# Patient Record
Sex: Female | Born: 1940
Health system: Southern US, Community
[De-identification: ages and names within clinical notes are randomized; demographics above are authoritative.]

## PROBLEM LIST (undated history)

## (undated) DIAGNOSIS — M1990 Unspecified osteoarthritis, unspecified site: Secondary | ICD-10-CM

## (undated) DIAGNOSIS — I10 Essential (primary) hypertension: Secondary | ICD-10-CM

## (undated) DIAGNOSIS — D649 Anemia, unspecified: Secondary | ICD-10-CM

## (undated) DIAGNOSIS — B009 Herpesviral infection, unspecified: Secondary | ICD-10-CM

## (undated) DIAGNOSIS — J45909 Unspecified asthma, uncomplicated: Secondary | ICD-10-CM

## (undated) DIAGNOSIS — R000 Tachycardia, unspecified: Secondary | ICD-10-CM

## (undated) DIAGNOSIS — K219 Gastro-esophageal reflux disease without esophagitis: Secondary | ICD-10-CM

## (undated) DIAGNOSIS — H269 Unspecified cataract: Secondary | ICD-10-CM

## (undated) DIAGNOSIS — R001 Bradycardia, unspecified: Secondary | ICD-10-CM

## (undated) DIAGNOSIS — E785 Hyperlipidemia, unspecified: Secondary | ICD-10-CM

## (undated) DIAGNOSIS — IMO0002 Reserved for concepts with insufficient information to code with codable children: Secondary | ICD-10-CM

## (undated) DIAGNOSIS — Z860100 Personal history of colon polyps, unspecified: Secondary | ICD-10-CM

## (undated) DIAGNOSIS — R9430 Abnormal result of cardiovascular function study, unspecified: Secondary | ICD-10-CM

## (undated) DIAGNOSIS — R011 Cardiac murmur, unspecified: Secondary | ICD-10-CM

## (undated) DIAGNOSIS — K589 Irritable bowel syndrome without diarrhea: Secondary | ICD-10-CM

## (undated) DIAGNOSIS — J189 Pneumonia, unspecified organism: Secondary | ICD-10-CM

## (undated) DIAGNOSIS — Z85828 Personal history of other malignant neoplasm of skin: Secondary | ICD-10-CM

## (undated) DIAGNOSIS — E663 Overweight: Secondary | ICD-10-CM

## (undated) HISTORY — DX: Personal history of other malignant neoplasm of skin: Z85.828

## (undated) HISTORY — DX: Essential (primary) hypertension: I10

## (undated) HISTORY — DX: Unspecified asthma, uncomplicated: J45.909

## (undated) HISTORY — DX: Anemia, unspecified: D64.9

## (undated) HISTORY — DX: Hyperlipidemia, unspecified: E78.5

## (undated) HISTORY — DX: Personal history of colon polyps, unspecified: Z86.0100

## (undated) HISTORY — DX: Pneumonia, unspecified organism: J18.9

## (undated) HISTORY — DX: Personal history of colonic polyps: Z86.010

## (undated) HISTORY — DX: Bradycardia, unspecified: R00.1

## (undated) HISTORY — DX: Unspecified osteoarthritis, unspecified site: M19.90

## (undated) HISTORY — DX: Tachycardia, unspecified: R00.0

## (undated) HISTORY — PX: OTHER SURGICAL HISTORY: SHX169

## (undated) HISTORY — DX: Irritable bowel syndrome, unspecified: K58.9

## (undated) HISTORY — DX: Unspecified cataract: H26.9

## (undated) HISTORY — DX: Herpesviral infection, unspecified: B00.9

## (undated) HISTORY — PX: PARTIAL HYSTERECTOMY: SHX80

## (undated) HISTORY — PX: OVARY SURGERY: SHX727

## (undated) HISTORY — PX: TONSILLECTOMY: SUR1361

## (undated) HISTORY — PX: CATARACT EXTRACTION: SUR2

## (undated) HISTORY — DX: Reserved for concepts with insufficient information to code with codable children: IMO0002

## (undated) HISTORY — PX: APPENDECTOMY: SHX54

## (undated) HISTORY — DX: Abnormal result of cardiovascular function study, unspecified: R94.30

## (undated) HISTORY — DX: Gastro-esophageal reflux disease without esophagitis: K21.9

## (undated) HISTORY — DX: Overweight: E66.3

---

## 1999-06-04 ENCOUNTER — Ambulatory Visit (HOSPITAL_COMMUNITY): Admission: RE | Admit: 1999-06-04 | Discharge: 1999-06-04 | Payer: Self-pay | Admitting: Internal Medicine

## 1999-06-21 ENCOUNTER — Emergency Department (HOSPITAL_COMMUNITY): Admission: EM | Admit: 1999-06-21 | Discharge: 1999-06-21 | Payer: Self-pay | Admitting: *Deleted

## 2000-05-17 ENCOUNTER — Encounter: Admission: RE | Admit: 2000-05-17 | Discharge: 2000-05-17 | Payer: Self-pay | Admitting: Internal Medicine

## 2000-05-17 ENCOUNTER — Encounter: Payer: Self-pay | Admitting: Internal Medicine

## 2002-04-17 ENCOUNTER — Other Ambulatory Visit: Admission: RE | Admit: 2002-04-17 | Discharge: 2002-04-17 | Payer: Self-pay | Admitting: Gynecology

## 2002-04-26 ENCOUNTER — Encounter: Payer: Self-pay | Admitting: Internal Medicine

## 2002-04-26 ENCOUNTER — Encounter: Admission: RE | Admit: 2002-04-26 | Discharge: 2002-04-26 | Payer: Self-pay | Admitting: Internal Medicine

## 2002-07-03 ENCOUNTER — Ambulatory Visit (HOSPITAL_COMMUNITY): Admission: RE | Admit: 2002-07-03 | Discharge: 2002-07-03 | Payer: Self-pay | Admitting: Internal Medicine

## 2003-01-15 ENCOUNTER — Ambulatory Visit (HOSPITAL_BASED_OUTPATIENT_CLINIC_OR_DEPARTMENT_OTHER): Admission: RE | Admit: 2003-01-15 | Discharge: 2003-01-15 | Payer: Self-pay | Admitting: Internal Medicine

## 2003-04-22 ENCOUNTER — Other Ambulatory Visit: Admission: RE | Admit: 2003-04-22 | Discharge: 2003-04-22 | Payer: Self-pay | Admitting: Gynecology

## 2004-05-06 ENCOUNTER — Other Ambulatory Visit: Admission: RE | Admit: 2004-05-06 | Discharge: 2004-05-06 | Payer: Self-pay | Admitting: Gynecology

## 2004-10-22 ENCOUNTER — Ambulatory Visit (HOSPITAL_COMMUNITY): Admission: RE | Admit: 2004-10-22 | Discharge: 2004-10-22 | Payer: Self-pay | Admitting: Internal Medicine

## 2005-07-06 ENCOUNTER — Other Ambulatory Visit: Admission: RE | Admit: 2005-07-06 | Discharge: 2005-07-06 | Payer: Self-pay | Admitting: Gynecology

## 2005-12-15 ENCOUNTER — Encounter: Admission: RE | Admit: 2005-12-15 | Discharge: 2005-12-15 | Payer: Self-pay | Admitting: Internal Medicine

## 2006-05-02 ENCOUNTER — Encounter: Admission: RE | Admit: 2006-05-02 | Discharge: 2006-05-02 | Payer: Self-pay | Admitting: Internal Medicine

## 2006-06-02 ENCOUNTER — Encounter: Admission: RE | Admit: 2006-06-02 | Discharge: 2006-06-02 | Payer: Self-pay | Admitting: Internal Medicine

## 2006-06-05 ENCOUNTER — Encounter: Admission: RE | Admit: 2006-06-05 | Discharge: 2006-06-05 | Payer: Self-pay | Admitting: Internal Medicine

## 2006-12-07 ENCOUNTER — Encounter: Admission: RE | Admit: 2006-12-07 | Discharge: 2006-12-07 | Payer: Self-pay | Admitting: Internal Medicine

## 2007-01-12 ENCOUNTER — Encounter: Admission: RE | Admit: 2007-01-12 | Discharge: 2007-01-12 | Payer: Self-pay | Admitting: Internal Medicine

## 2007-09-12 ENCOUNTER — Encounter: Admission: RE | Admit: 2007-09-12 | Discharge: 2007-09-12 | Payer: Self-pay | Admitting: Internal Medicine

## 2008-09-17 ENCOUNTER — Encounter: Payer: Self-pay | Admitting: Internal Medicine

## 2008-09-17 DIAGNOSIS — B009 Herpesviral infection, unspecified: Secondary | ICD-10-CM | POA: Insufficient documentation

## 2008-09-17 DIAGNOSIS — Z860100 Personal history of colon polyps, unspecified: Secondary | ICD-10-CM | POA: Insufficient documentation

## 2008-09-17 DIAGNOSIS — K589 Irritable bowel syndrome without diarrhea: Secondary | ICD-10-CM | POA: Insufficient documentation

## 2008-09-17 DIAGNOSIS — K649 Unspecified hemorrhoids: Secondary | ICD-10-CM | POA: Insufficient documentation

## 2008-09-18 ENCOUNTER — Ambulatory Visit: Payer: Self-pay | Admitting: Internal Medicine

## 2008-09-23 ENCOUNTER — Ambulatory Visit: Payer: Self-pay | Admitting: Internal Medicine

## 2009-02-13 ENCOUNTER — Ambulatory Visit: Payer: Self-pay | Admitting: Cardiology

## 2009-02-13 ENCOUNTER — Encounter (INDEPENDENT_AMBULATORY_CARE_PROVIDER_SITE_OTHER): Payer: Self-pay | Admitting: Emergency Medicine

## 2009-02-13 ENCOUNTER — Observation Stay (HOSPITAL_COMMUNITY): Admission: EM | Admit: 2009-02-13 | Discharge: 2009-02-15 | Payer: Self-pay | Admitting: Emergency Medicine

## 2009-02-17 ENCOUNTER — Telehealth (INDEPENDENT_AMBULATORY_CARE_PROVIDER_SITE_OTHER): Payer: Self-pay | Admitting: Radiology

## 2009-02-17 ENCOUNTER — Ambulatory Visit: Payer: Self-pay

## 2009-02-17 ENCOUNTER — Ambulatory Visit: Payer: Self-pay | Admitting: Cardiology

## 2009-02-17 DIAGNOSIS — Z85828 Personal history of other malignant neoplasm of skin: Secondary | ICD-10-CM | POA: Insufficient documentation

## 2009-02-17 DIAGNOSIS — I498 Other specified cardiac arrhythmias: Secondary | ICD-10-CM | POA: Insufficient documentation

## 2009-02-17 DIAGNOSIS — E785 Hyperlipidemia, unspecified: Secondary | ICD-10-CM | POA: Insufficient documentation

## 2009-02-18 ENCOUNTER — Encounter: Payer: Self-pay | Admitting: Cardiology

## 2009-02-18 ENCOUNTER — Ambulatory Visit: Payer: Self-pay

## 2009-03-18 ENCOUNTER — Ambulatory Visit: Payer: Self-pay | Admitting: Cardiology

## 2010-07-21 ENCOUNTER — Encounter: Payer: Self-pay | Admitting: Cardiology

## 2010-07-22 ENCOUNTER — Ambulatory Visit: Payer: Self-pay | Admitting: Cardiology

## 2010-07-22 ENCOUNTER — Encounter: Payer: Self-pay | Admitting: Cardiology

## 2010-07-22 DIAGNOSIS — R002 Palpitations: Secondary | ICD-10-CM | POA: Insufficient documentation

## 2010-07-28 ENCOUNTER — Ambulatory Visit: Payer: Self-pay

## 2010-08-27 ENCOUNTER — Ambulatory Visit: Payer: Self-pay | Admitting: Cardiology

## 2010-10-06 ENCOUNTER — Ambulatory Visit: Payer: Self-pay | Admitting: Cardiology

## 2010-10-31 ENCOUNTER — Encounter: Payer: Self-pay | Admitting: Internal Medicine

## 2010-11-09 NOTE — Assessment & Plan Note (Signed)
Summary: F1Y/CHEST DISCOMFORT/RACING HEART/JML   Visit Type:  Follow-up Primary Provider:  Nila Nephew, MD  CC:  rapid heartbeat.  History of Present Illness: The patient is seen for cardiology followup.  I saw her last in June, 2010.  She had been hospitalized in May, 2010.  He felt weak and there was some bradycardia at that time.  There was no MI.  2-D echo revealed an ejection fraction of 60%.  She had a nuclear stress test as an outpatient.  She walked for only 4 minutes.  However the ejection fraction was normal and there was no definite ischemia.  She has been on verapamil.  Today she tells me that she has had sensation of rapid heartbeat.  She has filled her prescription for metoprolol, but she has not yet taken any.  She's not having any significant chest pain.  There is no syncope or presyncope.  Current Medications (verified): 1)  Verapamil Hcl Cr 240 Mg Xr24h-Cap (Verapamil Hcl) .... Take 1 Tablet By Mouth Once A Day 2)  Tylenol Extra Strength 500 Mg Tabs (Acetaminophen) .... As Needed 3)  Furosemide 40 Mg Tabs (Furosemide) .... Take 1/2 Tablet By Mouth Daily. 4)  Metoprolol Succinate 25 Mg Xr24h-Tab (Metoprolol Succinate) .... Take One Tablet By Mouth Daily --- Not Started 5)  Detrol La 4 Mg Xr24h-Cap (Tolterodine Tartrate) .... Once Daily  Allergies (verified): No Known Drug Allergies  Past History:  Past Medical History: EF 60%.... echo.... May, 2010 Nuclear stress... May, 2010.... walked 4 minutes.... EF 70%... no scar or ischemia DYSLIPIDEMIA (ICD-272.4) BRADYCARDIA (ICD-427.89)  ... improved with beta blocker stopped Overweight IBS (ICD-564.1) HEMORRHOIDS (ICD-455.6) COLONIC POLYPS, HX OF (ICD-V12.72) HERPES SIMPLEX INFECTION, TYPE I (ICD-054.9) SKIN CANCER, HX OF (ICD-V10.83) Chest pain Light treatment for skin disease at her dermatologist Rapid heartbeat    October, 2011  Review of Systems       Patient denies fever, chills, headache, sweats, rash, change  in vision, change in hearing, chest pain, cough, nausea vomiting, urinary symptoms.  All other systems are reviewed and are negative.  Vital Signs:  Patient profile:   70 year old female Height:      62.5 inches Weight:      220 pounds BMI:     39.74 Pulse rate:   73 / minute BP sitting:   132 / 78  (left arm) Cuff size:   large  Vitals Entered By: Hardin Negus, RMA (July 22, 2010 9:25 AM)  Physical Exam  General:  The patient is stable but overweight. Head:  head is atraumatic. Eyes:  no xanthelasma. Neck:  no jugular venous distention. Chest Wall:  no chest wall tenderness. Lungs:  lungs are clear.  Respiratory effort is nonlabored. Heart:  cardiac exam reveals S1-S2.  No clicks.  There is a very soft systolic murmur. Abdomen:  abdomen is obese but soft. Msk:  no musculoskeletal deformities. Extremities:  no peripheral edema. Psych:  patient is oriented to person time and place.  Affect is normal.   Impression & Recommendations:  Problem # 1:  * RAPID HEARTBEAT  The patient has complained of rapid heart rate.  Beta blocker was suggested to her.  She has not filled it because she remembers that there was some slow heart rate in 2010.  There was no documented severe bradycardia at that time.  He was mild bradycardia per the records.  She is having continued symptoms.  We will proceed with an event recorder to see if we can document  what rhythm she is actually having.  In the meantime she will remain on verapamil but she will not yet start the beta blocker.EKG is done today and reviewed by me.  There is normal sinus rhythm and no significant change.  Orders: Event (Event)  Problem # 2:  CHEST PAIN (ICD-786.50) She has not had any significant chest pain.  Problem # 3:  BRADYCARDIA (ICD-427.89) At this point she is not bradycardic.  This will be followed and we will see what her rhythm is with the event recorder.  Problem # 4:  HERPES SIMPLEX INFECTION, TYPE I  (ICD-054.9) The patient receives a type of light treatment from her dermatologist.  We need to be careful as to whether or not this will affect the monitor that is placed.  Other Orders: EKG w/ Interpretation (93000)  Patient Instructions: 1)  Your physician has recommended that you wear an event monitor.  Event monitors are medical devices that record the heart's electrical activity. Doctors most often use these monitors to diagnose arrhythmias. Arrhythmias are problems with the speed or rhythm of the heartbeat. The monitor is a small, portable device. You can wear one while you do your normal daily activities. This is usually used to diagnose what is causing palpitations/syncope (passing out). 2)  Follow up in 5 weeks

## 2010-11-09 NOTE — Miscellaneous (Signed)
  Clinical Lists Changes  Problems: Removed problem of RHEUMATIC HEART DISEASE, HX OF (ICD-V12.59) Removed problem of HYPOTENSION (ICD-458.9) Removed problem of TACHYARRHYTHMIA (ICD-785.0) Removed problem of HEART BLOCK (ICD-426.9) Removed problem of FLATULENCE (ICD-787.3) Removed problem of RECTAL BLEEDING (ICD-569.3) Removed problem of PERSONAL HX COLONIC POLYPS (ICD-V12.72) Observations: Added new observation of PAST MED HX: EF 60%.... echo.... May, 2010 Nuclear stress... May, 2010.... walked 4 minutes.... EF 70%... no scar or ischemia DYSLIPIDEMIA (ICD-272.4) BRADYCARDIA (ICD-427.89)  ... improved with beta blocker stopped Overweight IBS (ICD-564.1) HEMORRHOIDS (ICD-455.6) COLONIC POLYPS, HX OF (ICD-V12.72) HERPES SIMPLEX INFECTION, TYPE I (ICD-054.9) SKIN CANCER, HX OF (ICD-V10.83) Chest pain (07/21/2010 17:35) Added new observation of PRIMARY MD: Nila Nephew, MD (07/21/2010 17:35)       Past History:  Past Medical History: EF 60%.... echo.... May, 2010 Nuclear stress... May, 2010.... walked 4 minutes.... EF 70%... no scar or ischemia DYSLIPIDEMIA (ICD-272.4) BRADYCARDIA (ICD-427.89)  ... improved with beta blocker stopped Overweight IBS (ICD-564.1) HEMORRHOIDS (ICD-455.6) COLONIC POLYPS, HX OF (ICD-V12.72) HERPES SIMPLEX INFECTION, TYPE I (ICD-054.9) SKIN CANCER, HX OF (ICD-V10.83) Chest pain

## 2010-11-09 NOTE — Assessment & Plan Note (Signed)
Summary: 5wk f/u sl   Visit Type:  Follow-up Primary Provider:  Nila Nephew, MD  CC:  palpitations.  History of Present Illness: The patient is seen for followup of palpitations.  I saw her last July 22, 2010.  Before that we have seen her in the hospital.  She felt fatigue.  There was question of slight bradycardia but nothing significant.  She had normal left ventricular function.  Office chief complaint of a rapid heartbeat.  We decided to place an event recorder.  She wear this for 21 days and I have reviewed it completely.  She has sinus rhythm throughout the study.  There is some bradycardia but nothing marked.  There is some mild sinus tachycardia but nothing persistent.  At that time was that she did not feel well she had sinus rhythm.  I believe that she is sensing mild increase in sinus rhythm when she feels poorly.  This tends to occur in the morning as she wakes up.  Current Medications (verified): 1)  Verapamil Hcl Cr 240 Mg Xr24h-Cap (Verapamil Hcl) .... Take 1 Tablet By Mouth Once A Day 2)  Tylenol Extra Strength 500 Mg Tabs (Acetaminophen) .... As Needed 3)  Furosemide 40 Mg Tabs (Furosemide) .... Take 1/2 Tablet By Mouth Daily. 4)  Metoprolol Succinate 25 Mg Xr24h-Tab (Metoprolol Succinate) .... Take One Tablet By Mouth Daily --- Not Started 5)  Detrol La 4 Mg Xr24h-Cap (Tolterodine Tartrate) .... Once Daily  Allergies (verified): No Known Drug Allergies  Past History:  Past Medical History: EF 60%.... echo.... May, 2010 Nuclear stress... May, 2010.... walked 4 minutes.... EF 70%... no scar or ischemia DYSLIPIDEMIA (ICD-272.4) BRADYCARDIA (ICD-427.89)  ... improved with beta blocker stopped Overweight IBS (ICD-564.1) HEMORRHOIDS (ICD-455.6) COLONIC POLYPS, HX OF (ICD-V12.72) HERPES SIMPLEX INFECTION, TYPE I (ICD-054.9) SKIN CANCER, HX OF (ICD-V10.83) Chest pain Light treatment for skin disease at her dermatologist Rapid heartbeat    October, 2011....21 day  monitor... sinus rhythm... patient senses mild increase in heart rate low dose beta-blockade to be tried  Review of Systems       Patient denies fever, chills, headache, sweats, rash, change in vision, change in hearing, chest pain, cough, nausea vomiting, urinary symptoms.  All other systems are reviewed and are negative.  Vital Signs:  Patient profile:   70 year old female Height:      62.5 inches Weight:      219 pounds BMI:     39.56 Pulse rate:   80 / minute BP sitting:   136 / 80  (left arm) Cuff size:   regular  Vitals Entered By: Hardin Negus, RMA (August 27, 2010 2:49 PM)  Physical Exam  General:  patient is stable. Eyes:  no xanthelasma. Neck:  no jugular venous distention. Lungs:  lungs are clear.  Respiratory effort is nonlabored. Heart:  cardiac exam reveals S1-S2.  No clicks or significant murmurs. Abdomen:  abdomen is soft. Extremities:  no peripheral edema. Skin:  no skin rashes. Psych:  patient is oriented to person time and place.  Affect is normal.   Impression & Recommendations:  Problem # 1:  PALPITATIONS (ICD-785.1) The patient does not have any documented arrhythmias.  I believe that she senses a mild increase in her heart rate.  We will try low-dose beta blocker by giving it to her at nighttime as she tends to feel these sensations when she is awakening in the morning.  Problem # 2:  CHEST PAIN (ICD-786.50) There is no recurrent chest  pain.  No further workup needed.  Problem # 3:  BRADYCARDIA (ICD-427.89) There have been questioning the hospital slight bradycardia.  We did not see any marked bradycardia on her record.  I feel it is safe to use low-dose beta-blockade.  At some point we may need to move away from verapamil as one of her other medicines.  Patient Instructions: 1)  Start taking Metoprolol Succ. 1/2 at bedtime  2)  Follow up in 6 weeks

## 2010-11-11 NOTE — Assessment & Plan Note (Signed)
Summary: PER CHECK OUT/SF   Visit Type:  Follow-up Primary Terrill Alperin:  Nila Nephew, MD  CC:  palpitations.  History of Present Illness: Patient is seen for followup of palpitations.  I saw her last August 27, 2010.  We increased the dose of her beta blocker having her take an evening dose.  She feels better.  She still has mild palpitations but they are not as frequent or as noticeable.  Current Medications (verified): 1)  Verapamil Hcl Cr 240 Mg Xr24h-Cap (Verapamil Hcl) .... Take 1 Tablet By Mouth Once A Day 2)  Tylenol Extra Strength 500 Mg Tabs (Acetaminophen) .... As Needed 3)  Furosemide 40 Mg Tabs (Furosemide) .... Take 1/2 Tablet By Mouth Daily. 4)  Metoprolol Succinate 25 Mg Xr24h-Tab (Metoprolol Succinate) .... Take 1/2 Tab At Bedtime 5)  Nystatin 100000 Unit/gm Powd (Nystatin) .... Use Twice A Day 6)  Hydrocortisone 2.5 % Crea (Hydrocortisone) .... Use Twice A Day  Allergies (verified): No Known Drug Allergies  Past History:  Past Medical History: EF 60%.... echo.... May, 2010 Nuclear stress... May, 2010.... walked 4 minutes.... EF 70%... no scar or ischemia DYSLIPIDEMIA (ICD-272.4) BRADYCARDIA (ICD-427.89)  ... improved with beta blocker stopped Overweight IBS (ICD-564.1) HEMORRHOIDS (ICD-455.6) COLONIC POLYPS, HX OF (ICD-V12.72) HERPES SIMPLEX INFECTION, TYPE I (ICD-054.9).. SKIN CANCER, HX OF (ICD-V10.83) Chest pain Light treatment for skin disease at her dermatologist Rapid heartbeat    October, 2011....21 day monitor... sinus rhythm... patient senses mild increase in heart rate low dose beta-blockade to be tried  Review of Systems       Patient denies fever, chills, headache, sweats, rash, change in vision, change in hearing, chest pain, cough, nausea vomiting, urinary symptoms.  All other systems are reviewed and are negative.  Vital Signs:  Patient profile:   70 year old female Height:      62.5 inches Weight:      225.50 pounds BMI:     40.73 Pulse  rate:   70 / minute BP sitting:   126 / 62  (left arm)  Vitals Entered By: Haze Boyden, CMA (October 06, 2010 2:07 PM)  Physical Exam  General:  patient is stable today. Eyes:  normal extraocular motion. Neck:  no carotid bruits. Lungs:  lungs are clear.  Respiratory effort is nonlabored. Heart:  cardiac exam reveals an S1-S2.  There are no clicks or significant murmurs. Abdomen:  abdomen is soft. Extremities:  no peripheral edema. Psych:  patient is oriented to person time and place.  Affect is normal.   Impression & Recommendations:  Problem # 1:  PALPITATIONS (ICD-785.1)  Her updated medication list for this problem includes:    Verapamil Hcl Cr 240 Mg Xr24h-cap (Verapamil hcl) .Marland Kitchen... Take 1 tablet by mouth once a day    Metoprolol Succinate 25 Mg Xr24h-tab (Metoprolol succinate) .Marland Kitchen... Take 1/2 tab at bedtime Stable wiith the dosing regimen for her palpitations.  No further workup is needed.  I will see her back in one year for cardiology followup if it is appropriate.  Problem # 2:  BRADYCARDIA (ICD-427.89)  Her updated medication list for this problem includes:    Verapamil Hcl Cr 240 Mg Xr24h-cap (Verapamil hcl) .Marland Kitchen... Take 1 tablet by mouth once a day    Metoprolol Succinate 25 Mg Xr24h-tab (Metoprolol succinate) .Marland Kitchen... Take 1/2 tab at bedtime We are not seeing any significant bradycardia from the beta blocker.  Problem # 3:  CHEST PAIN (ICD-786.50)  Her updated medication list for this problem includes:  Verapamil Hcl Cr 240 Mg Xr24h-cap (Verapamil hcl) .Marland Kitchen... Take 1 tablet by mouth once a day    Metoprolol Succinate 25 Mg Xr24h-tab (Metoprolol succinate) .Marland Kitchen... Take 1/2 tab at bedtime There is been no recurrent chest pain.  No further workup.  Patient Instructions: 1)  Your physician wants you to follow-up in:  1 year.  You will receive a reminder letter in the mail two months in advance. If you don't receive a letter, please call our office to schedule the  follow-up appointment.

## 2011-01-18 LAB — BASIC METABOLIC PANEL
BUN: 12 mg/dL (ref 6–23)
Creatinine, Ser: 1.05 mg/dL (ref 0.4–1.2)
GFR calc non Af Amer: 53 mL/min — ABNORMAL LOW (ref >60)
Glucose, Bld: 106 mg/dL — ABNORMAL HIGH (ref 70–99)
Potassium: 4.1 mEq/L (ref 3.5–5.1)

## 2011-01-18 LAB — HEPATIC FUNCTION PANEL
Alkaline Phosphatase: 82 U/L (ref 39–117)
Indirect Bilirubin: 0.5 mg/dL (ref 0.3–0.9)
Total Bilirubin: 0.6 mg/dL (ref 0.3–1.2)

## 2011-01-18 LAB — POCT I-STAT, CHEM 8
BUN: 14 mg/dL (ref 6–23)
Calcium, Ion: 1.16 mmol/L (ref 1.12–1.32)
Chloride: 106 mEq/L (ref 96–112)
Creatinine, Ser: 1.2 mg/dL (ref 0.4–1.2)
Glucose, Bld: 105 mg/dL — ABNORMAL HIGH (ref 70–99)
HCT: 39 % (ref 36.0–46.0)
Hemoglobin: 13.3 g/dL (ref 12.0–15.0)
Potassium: 3.8 mEq/L (ref 3.5–5.1)
Sodium: 140 mEq/L (ref 135–145)
TCO2: 27 mmol/L (ref 0–100)

## 2011-01-18 LAB — CBC
HCT: 36.3 % (ref 36.0–46.0)
HCT: 37.4 % (ref 36.0–46.0)
Hemoglobin: 12.9 g/dL (ref 12.0–15.0)
MCHC: 34.3 g/dL (ref 30.0–36.0)
Platelets: 161 10*3/uL (ref 150–400)
RBC: 4.18 MIL/uL (ref 3.87–5.11)
RDW: 14.1 % (ref 11.5–15.5)
RDW: 14.3 % (ref 11.5–15.5)
WBC: 7.7 10*3/uL (ref 4.0–10.5)

## 2011-01-18 LAB — COMPREHENSIVE METABOLIC PANEL
ALT: 9 U/L (ref 0–35)
AST: 15 U/L (ref 0–37)
Albumin: 3.4 g/dL — ABNORMAL LOW (ref 3.5–5.2)
CO2: 28 mEq/L (ref 19–32)
Calcium: 9.3 mg/dL (ref 8.4–10.5)
Creatinine, Ser: 1.02 mg/dL (ref 0.4–1.2)
GFR calc Af Amer: >60 mL/min (ref >60)
GFR calc non Af Amer: 54 mL/min — ABNORMAL LOW (ref >60)
Sodium: 138 mEq/L (ref 135–145)
Total Protein: 7 g/dL (ref 6.0–8.3)

## 2011-01-18 LAB — DIFFERENTIAL
Basophils Absolute: 0 10*3/uL (ref 0.0–0.1)
Basophils Absolute: 0 10*3/uL (ref 0.0–0.1)
Basophils Relative: 1 % (ref 0–1)
Eosinophils Absolute: 0.1 10*3/uL (ref 0.0–0.7)
Eosinophils Relative: 1 % (ref 0–5)
Eosinophils Relative: 1 % (ref 0–5)
Lymphocytes Relative: 29 % (ref 12–46)
Lymphocytes Relative: 30 % (ref 12–46)
Lymphs Abs: 2.2 10*3/uL (ref 0.7–4.0)
Monocytes Absolute: 0.4 10*3/uL (ref 0.1–1.0)
Monocytes Relative: 5 % (ref 3–12)
Neutrophils Relative %: 60 % (ref 43–77)

## 2011-01-18 LAB — BRAIN NATRIURETIC PEPTIDE: Pro B Natriuretic peptide (BNP): 56 pg/mL (ref 0.0–100.0)

## 2011-01-18 LAB — CK TOTAL AND CKMB (NOT AT ARMC)
CK, MB: 0.5 ng/mL (ref 0.3–4.0)
Relative Index: INVALID (ref 0.0–2.5)
Relative Index: INVALID (ref 0.0–2.5)
Total CK: 51 U/L (ref 7–177)
Total CK: 54 U/L (ref 7–177)

## 2011-01-18 LAB — POCT CARDIAC MARKERS: Troponin i, poc: <0.05 ng/mL (ref 0.00–0.09)

## 2011-01-18 LAB — APTT: aPTT: 31 seconds (ref 24–37)

## 2011-01-18 LAB — CARDIAC PANEL(CRET KIN+CKTOT+MB+TROPI)
Relative Index: INVALID (ref 0.0–2.5)
Relative Index: INVALID (ref 0.0–2.5)
Total CK: 49 U/L (ref 7–177)
Total CK: 51 U/L (ref 7–177)
Troponin I: 0.01 ng/mL (ref 0.00–0.06)

## 2011-01-18 LAB — TROPONIN I
Troponin I: 0.01 ng/mL (ref 0.00–0.06)
Troponin I: 0.01 ng/mL (ref 0.00–0.06)

## 2011-01-18 LAB — LIPID PANEL
Cholesterol: 195 mg/dL (ref 0–200)
HDL: 33 mg/dL — ABNORMAL LOW (ref >39)
LDL Cholesterol: 143 mg/dL — ABNORMAL HIGH (ref 0–99)

## 2011-01-18 LAB — PROTIME-INR
INR: 1.1 (ref 0.00–1.49)
Prothrombin Time: 14 seconds (ref 11.6–15.2)

## 2011-01-18 LAB — TSH: TSH: 2.696 u[IU]/mL (ref 0.350–4.500)

## 2011-01-18 LAB — MAGNESIUM: Magnesium: 2.2 mg/dL (ref 1.5–2.5)

## 2011-02-22 NOTE — Consult Note (Signed)
NAMESHARNA, [name] NO.:  [id number]   MEDICAL RECORD NO.:  [id number]          PATIENT TYPE:  INP   LOCATION:  2023                         FACILITY:  [facility]   PHYSICIAN:  Luis Abed, MD, FACCDATE OF BIRTH:  February 04, 1941   DATE OF CONSULTATION:  02/14/2009  DATE OF DISCHARGE:                                 CONSULTATION   PRIMARY CARE PHYSICIAN:  Erskine Speed, MD   PRIMARY CARDIOLOGIST:  Ellis Parents Luis Abed, MD, [facility]   CHIEF COMPLAINT:  Chest pain/palpitations.   HISTORY OF PRESENT ILLNESS:  [name] is a 70 year old female with  no previous history of coronary artery disease.  She has been on  atenolol and verapamil for years because of a history of labile  hypertension and tachy palpitations.  On the day of admission, she was  complaining of some weakness and her heart rate was noted to be in the  low to mid 40s by her primary care physician.  She was sent to the  hospital and admitted.  She had some chest pressure and has had some  tachy palpitations, so Cardiology was asked to evaluate her.   Since being admitted to the hospital, the verapamil and atenolol have  been on hold.  Her heart rate has improved and is now generally normal.  [name] has had chest pain several times.  One type of chest pain  happens when she lies on her back.  This has been going on for a long  term, and the pain is relieved by turning on her side.  This pain has  not changed in intensity recently and it is a type of pain that she had  last night.  Of note, she has also had a knot in her chest at times  which is not exertional and she states was brought on by being on  VESIcare.  The medication was stopped for a while and these symptoms  resolved, but she has been put back on VESIcare and the symptoms have  started again but are not as severe as they were at first.  Additionally, she had another episode of pain in this situation  surrounding, it is not clear but  she states that she had a sharp chest  pain that went down both of her arms prior to admission, which has not  recurred.   PAST MEDICAL HISTORY:  1. Hypertension.  2. Hyperlipidemia.  3. Obesity.  4. History of irregular heart rate/tachy palpitations.  5. History of rheumatic fever as a child with subsequent rheumatic      heart disease.  6. History of skin cancer.   SURGICAL HISTORY:  She is status post vocal cord surgery as well as  tonsillectomy, hysterectomy, and oophorectomy.   ALLERGIES:  No known drug allergies.   CURRENT MEDICATIONS:  1. Aspirin 325 mg daily.  2. Lasix 40 mg a day.  3. Heparin 5000 units q.8 h.  4. Protonix 40 mg a day.  5. Zocor 20 mg a day.   SOCIAL HISTORY:  She lives in [place] alone and is a retired Nurse, mental health.  She quit tobacco 25 years ago with approximately 30-pack-year  history and denies any history of alcohol or drug abuse.   FAMILY HISTORY:  Her mother is alive at age 35, but no history of heart  disease and neither her father who died at 81 or any siblings have any  history of coronary artery disease.   REVIEW OF SYSTEMS:  She has had some diaphoresis which is not related to  the pain.  She has some chronic dyspnea on exertion but denies PND or  edema.  There may be some orthopnea, but this is chronic and has not  changed recently.  She has occasional tachy palpitations, but they do  not cause any associated symptoms.  They are not related to chest pain  or presyncope.  She has stress incontinence for which she needs  VESIcare.  She has occasional reflux symptoms and feels that she is  intolerant to cold weather.  Full 14-point review of systems is,  otherwise, negative.   PHYSICAL EXAMINATION:  VITAL SIGNS:  Temperature is 97.1, blood pressure  147/82, pulse 59, respiratory rate 20, O2 saturation 100% on 2 L.  GENERAL:  She is a well-developed, well-nourished African American  female in no acute distress.  HEENT:  Normal.   NECK:  There is no lymphadenopathy, thyromegaly, bruit, or JVD noted.  CV:  Her heart is regular in rate and rhythm with an S1 and S2 and a  systolic murmur is noted, louder at the apex.  Distal pulses are intact  in all 4 extremities.  LUNGS:  Clear to auscultation bilaterally.  SKIN:  No rashes or lesions are noted.  ABDOMEN:  Soft and nontender with active bowel sounds.  EXTREMITIES:  There is no cyanosis, clubbing, or edema noted.  MUSCULOSKELETAL:  There is no joint deformity or effusions and no spine  or CVA tenderness is noted.  NEURO:  She is alert and oriented.  Cranial nerves II through XII  grossly intact.   Chest x-ray, no acute disease.   EKG, sinus bradycardia, rate 45 with no acute changes.   LABORATORY VALUES:  CK-MB and troponin I negative x3.  TSH 2.696.  BNP  56.  Hemoglobin 12.4, hematocrit 36.3, WBC 7.7, platelets 161.  Sodium  140, potassium 4.1, chloride 103, CO2 of 30, BUN 12, creatinine 1.05,  glucose 106.   IMPRESSION:  [name] was seen today by Dr. Myrtis Ser.  At this time, she  is not unstable.  There is no proof of MI.  Her rate is slow but stable.  She does not have consistent exertional symptoms, and at this time, it  is appropriate to keep her off the beta-blocker as you are.  Her blood  pressure is being watched carefully and the Lasix has been restarted.  She can be started on other medications that do not slow her rate such  as Norvasc or Procardia if better blood pressure control is needed.  We  will arrange for an outpatient stress Myoview.  This will be an exercise  treadmill Myoview because of the need to assess her for chronotropic  incompetence.  She has had one in the past but says that she was not  able to complete the treadmill portion because of shortness of breath.  If she is not able to  complete the treadmill portion, adenosine or Lexiscan scan can be used.  We will also obtain an outpatient 2-D echocardiogram to evaluate her  mitral  valve and follow her for this  as an outpatient.  She will follow  up with Dr. Myrtis Ser.      Theodore Demark, PA-C      Luis Abed, MD, [facility]  Electronically Signed    RB/MEDQ  D:  02/14/2009  T:  02/15/2009  Job:  [id number]

## 2011-02-22 NOTE — Discharge Summary (Signed)
[name], [name]           ACCOUNT NO.:  [id number]   MEDICAL RECORD NO.:  [id number]          PATIENT TYPE:  INP   LOCATION:  2023                         FACILITY:  [facility]   PHYSICIAN:  Herbie Saxon, MDDATE OF BIRTH:  Aug 13, 1941   DATE OF ADMISSION:  02/12/2009  DATE OF DISCHARGE:  02/15/2009                               DISCHARGE SUMMARY   DISCHARGE DIAGNOSES:  1. First-degree heart block.  2. Symptomatic bradycardia, improved.  3. Morbid obesity.  4. Hypotension, stable.  5. Dyslipidemia.  6. History of rheumatic heart disease.  7. History of skin cancer.  8. History of tachyarrhythmia.   CONSULTS:  Luis Abed, MD, [facility], Cardiology.   PRIMARY CARE PHYSICIAN:  Erskine Speed, MD.   RADIOLOGY:  Chest x-ray of Feb 12, 2009, showed no acute cardiopulmonary  abnormality.   HOSPITAL COURSE:  This 70 year old African American lady presented to  the emergency room with generalized weakness and palpitation.  The  patient also noticed some chest discomfort which was as when she leans  back.  On this admission, cardiac enzymes and EKG were negative for  ischemic event.  However, the rhythm strip did show a first-degree heart  block.  A 2-D echocardiogram performed on Feb 13, 2009, showed that  systolic function was normal with ejection fraction of 60% and there  were no regional wall motion abnormalities of trivial aortic valve  regurgitation.  The patient was seen by Cardiology as of Feb 14, 2009,  and they were of the opinion that the patient is stable for discharge.  They concurred with keeping off a beta-blocker, recommending outpatient  stress Myoview test.  Of note, the patient had borderline hypotension on  Feb 14, 2009, and Lasix has also been put on hold.  D-dimer test of Feb 14, 2009, was also normal.  The patient's lipid panel on this admission,  did show evidence of dyslipidemia with an HDL cholesterol of 33 and LDL  cholesterol of 143.  The patient  has been started on Zocor 20 mg daily.   DISCHARGE CONDITION:  Stable.   DIET:  2-g sodium, heart healthy, low cholesterol.   ACTIVITY:  Increase slowly as tolerated.   FOLLOWUP:  Followup with Dr. Nila Nephew, primary care physician in the  next 1 week.  Followup with Dr. Willa Rough, Cardiology in the next 2-3  days.  A 12-D Myoview stress test performed as an outpatient.   MEDICATIONS ON DISCHARGE:  1. Vesicare 5 mg daily.  2. Nitroglycerin 0.4 mg sublingual p.r.n. q.5 minutes. x3.  3. Enteric-coated aspirin 81 mg daily.  4. Cymbalta 20 mg nightly.  5. Tylenol 650 mg q.6 h. p.r.n.  6. Maalox 30 mL q.6 h. p.r.n.   PHYSICAL EXAMINATION:  GENERAL:  Today, she is an elderly lady not in  acute distress.  VITAL SIGNS:  Stable.  Temperature 97.2, pulse 62, respiratory rate is  18, blood pressure 127/75, and saturating 100% on 2 L.  HEENT:  Pupils are equal, reactive to light and accommodation.  Head is  atraumatic and normocephalic.  Oropharynx and nasopharynx are clear.  NECK:  Supple.  No adenopathy.  No carotid bruits.  No thyromegaly.  No  elevated JVD.  CHEST:  Clinically clear.  HEART:  Heart sounds 1 and 2.  Regular rate and rhythm.  No murmurs,  rubs, gallops, or clicks.  LUNGS:  No wheezes, crackles, or rhonchi.  ABDOMEN:  Benign.  CNS:  No focal deficits.  Peripheral pulses present.  No pedal edema.   LABORATORY DATA:  Troponin 0.01, TSH 2.69.  Chemistry, sodium 140,  potassium 4.1, chloride 103, bicarbonate 30, glucose 106, BUN 12,  creatinine 1.0.  WBC 7.7, hematocrit 36.3, platelet count is 161.   The patient's illness, medication, test, and treatment plan were  discussed with her.  She verbalizes understanding.  Discharge time  greater than 30 minutes.      Herbie Saxon, MD  Electronically Signed     MIO/MEDQ  D:  02/15/2009  T:  02/16/2009  Job:  [id number]

## 2011-02-22 NOTE — H&P (Signed)
NAMEBREON, DISS           ACCOUNT NO.:  [id number]   MEDICAL RECORD NO.:  [id number]          PATIENT TYPE:  INP   LOCATION:  2023                         FACILITY:  [facility]   PHYSICIAN:  Richarda Overlie, MD       DATE OF BIRTH:  04-30-1941   DATE OF ADMISSION:  02/12/2009  DATE OF DISCHARGE:                              HISTORY & PHYSICAL   CHIEF COMPLAINT:  Decreased heart rate, weakness.   PRIMARY CARE PHYSICIAN:  Dr. Nila Nephew   HISTORY OF PRESENT ILLNESS:  Mrs. Heider is a 70 year old woman with  past medical history of rheumatic heart disease with subsequent murmur  and arrhythmia (questionable AFib, no records available) presenting for  slow heart rate, sent by her PCP.  On the day prior to admission, she  was running errands and, towards evening, while she was in a store, she  noticed generalized weakness and trouble getting her breath.  However,  she denies shortness of breath but was just mentioning that her  breathing was slower.  She rested and she felt better.  During the  night, she felt her heart rate increased and, the morning of admission,  she felt that her heart rate was slower than normal.  She did not take  her medicines for fear of stopping her heart.  She went to see her PCP  in the morning of admission and, since her heart rate was 48-49 and she  had generalized weakness and problems with breathing, he called EMS.   PAST MEDICAL HISTORY:  1. Hypertension  2. Tachycardia/irregular heart rate.  3. History of rheumatic heart disease with subsequent heart murmur.  4. Skin cancer (patient undergoing therapy).   MEDICATIONS:  Atenolol, verapamil, Lasix, and VESIcare.  The patient  mentions that she takes all of these once a day.  However she does not  remember the doses of the medications and her son should bring them to  the hospital.  She did not take the first 3 medicines the day of  admission.   SOCIAL HISTORY:  She lives in [place] alone, but  her sister lives  close.  She is a retired Engineer, site.  She smoked for 29 years but  quit 25 years ago.   ALLERGIES:  No known drug allergies.   REVIEW OF SYSTEMS:  No chest pain.  She has right upper quadrant/right  lower chest pain which feels like musculoskeletal/nerve pain (going  around her lower ribs).  No nausea or vomiting, diarrhea, constipation.  No shortness of breath (but mentions slow breathing, sweating, and  generalized weakness).   PHYSICAL EXAM:  VITAL SIGNS:  Per EMS, heart rate 56, respiratory rate  16, and blood pressure 210/96.  In the ED, her heart rate runs between  49 and 68, and blood pressure normalized to 112/50.  GENERAL:  She is in no acute distress, pleasant.  CARDIAC:  Bradycardia.  No murmurs, rubs, gallops.  Regular rhythm.  PULMONARY:  Clear to auscultation bilaterally.  No wheezes, crackles, or  rhonchi.  GI:  Soft, nontender, nondistended.  Bowel sounds positive.  No pain to  right upper  quadrant palpation.  EXTREMITIES:  No edema.  NEURO:  Nonfocal.   LABS:  Chest x-ray shows no acute changes.  EKG:  Sinus bradycardia with  45 beats per minute, otherwise normal.  CBC:  White blood count 8.1,  hemoglobin 12.9, platelets 180,000.  Point of care cardiac enzymes  negative x1.  Sodium 140, potassium 2.8, chloride 106, bicarb 27, BUN  14, creatinine 1.2, and glucose 105.   ASSESSMENT AND PLAN:  1. Bradycardia.  Unclear etiology in this patient on chronic      antiarrhythmic medication, possibly caused by increased exertion,      medication effect (atenolol and verapamil, however she did not take      them the morning of admission) or due to  acute coronary syndrome.      However, I doubt this etiology since no chest pain and only minimal      shortness of breath.  We will admit to Telemetry.  We will hold      atenolol and verapamil.  We will place atropine at bedside and      start aspirin 325.  We are going to check EKG in the morning, the       first EKG showing only sinus bradycardia and no atrioventricular      block.  We will also check cardiac enzymes x3, TSH, BNP, fasting      lipid profile, and a 2D echo in a.m.  The patient is currently      asymptomatic.  2. Hypertension.  Her blood pressure is normal now.  However, it was      much increased at the office of her PCP.  We will hold atenolol and      verapamil, but I believe it is safe to restart her Lasix.  The      patient should let us know about the doses of her home medication.  3. Prophylaxis.  Protonix and heparin subcu.      Carlus Pavlov, M.D.  Electronically Signed      Richarda Overlie, MD  Electronically Signed    CG/MEDQ  D:  02/13/2009  T:  02/13/2009  Job:  [id number]   cc:   Erskine Speed, M.D.

## 2011-08-23 ENCOUNTER — Other Ambulatory Visit: Payer: Self-pay | Admitting: Internal Medicine

## 2011-08-23 DIAGNOSIS — R6884 Jaw pain: Secondary | ICD-10-CM

## 2011-08-26 ENCOUNTER — Ambulatory Visit
Admission: RE | Admit: 2011-08-26 | Discharge: 2011-08-26 | Disposition: A | Discharge status: Home / Self Care | Payer: BC Managed Care – PPO | Source: Ambulatory Visit | Attending: Internal Medicine | Admitting: Internal Medicine

## 2011-08-26 DIAGNOSIS — R6884 Jaw pain: Secondary | ICD-10-CM

## 2011-08-26 MED ORDER — IOHEXOL 300 MG/ML  SOLN
75.0000 mL | Freq: Once | INTRAMUSCULAR | Status: AC | PRN
  Administered 2011-08-26: 75 mL via INTRAVENOUS

## 2011-09-09 ENCOUNTER — Encounter: Payer: Self-pay | Admitting: *Deleted

## 2011-09-09 ENCOUNTER — Encounter: Payer: Self-pay | Admitting: Cardiology

## 2011-09-13 ENCOUNTER — Encounter: Payer: Self-pay | Admitting: Cardiology

## 2011-09-13 DIAGNOSIS — E785 Hyperlipidemia, unspecified: Secondary | ICD-10-CM | POA: Insufficient documentation

## 2011-09-13 DIAGNOSIS — R000 Tachycardia, unspecified: Secondary | ICD-10-CM | POA: Insufficient documentation

## 2011-09-13 DIAGNOSIS — R079 Chest pain, unspecified: Secondary | ICD-10-CM | POA: Insufficient documentation

## 2011-09-13 DIAGNOSIS — R9430 Abnormal result of cardiovascular function study, unspecified: Secondary | ICD-10-CM | POA: Insufficient documentation

## 2011-09-14 ENCOUNTER — Ambulatory Visit (INDEPENDENT_AMBULATORY_CARE_PROVIDER_SITE_OTHER): Payer: Medicare Other | Admitting: Cardiology

## 2011-09-14 ENCOUNTER — Encounter: Payer: Self-pay | Admitting: Cardiology

## 2011-09-14 DIAGNOSIS — R000 Tachycardia, unspecified: Secondary | ICD-10-CM

## 2011-09-14 DIAGNOSIS — I498 Other specified cardiac arrhythmias: Secondary | ICD-10-CM

## 2011-09-14 DIAGNOSIS — R002 Palpitations: Secondary | ICD-10-CM

## 2011-09-14 DIAGNOSIS — R079 Chest pain, unspecified: Secondary | ICD-10-CM

## 2011-09-14 NOTE — Assessment & Plan Note (Signed)
On a low-dose of beta blockade the patient is not having any significant problems with bradycardia.  No further workup is needed.

## 2011-09-14 NOTE — Assessment & Plan Note (Signed)
The patient has a tendency to sense some increase in her heart rate.  P.r.n. Beta-blockade helps with this and she is stable.  No further workup is needed.

## 2011-09-14 NOTE — Patient Instructions (Signed)
Your physician wants you to follow-up in:  12 months.  You will receive a reminder letter in the mail two months in advance. If you don't receive a letter, please call our office to schedule the follow-up appointment.   

## 2011-09-14 NOTE — Progress Notes (Signed)
HPI  Patient is seen for cardiology followup I had seen her last December, 2011.  She has a history of some chest discomfort.  Nuclear scan revealed no ischemia in May, 2010.  She has an ejection fraction of 60% by history.  In October, 2011 she complained of a rapid heart rate.  She did wear an event recorder at that time.  She had sinus rhythm.  However it appeared that she sensed mild increases in her heart rate.  Low-dose beta blocker was started and she has done well.  There has been some mild bradycardia in the past and therefore only low doses used.  At times she feels a mild increase in heart rate now but she is quite stable.  She's not had any recurrent chest pain.  Recently she's had some headache that she describes as a sharp pain in her head.  This is being managed by Dr.Green, who she will see in followup tomorrow.  Patient does have some intermittent edema.  It seems that it is dependent edema and worse when her legs are down.  She does not give any history of signs of congestive heart failure.  As part of today's evaluation I have reviewed my old records and I have completely updated the new electronic medical record.  No Known Allergies  Current Outpatient Prescriptions  Medication Sig Dispense Refill  . acetaminophen (TYLENOL) 500 MG tablet Take 500 mg by mouth every 6 (six) hours as needed.        . clobetasol cream (TEMOVATE) 0.05 % Apply 1 application topically. Every other week       . docusate sodium (COLACE) 100 MG capsule Take 100 mg by mouth as needed.        . fesoterodine (TOVIAZ) 4 MG TB24 Take 4 mg by mouth daily.        . furosemide (LASIX) 40 MG tablet Take 20 mg by mouth daily.       . metoprolol succinate (TOPROL-XL) 25 MG 24 hr tablet Take 25 mg by mouth daily. 1/2 tablet at bedtime      . nystatin (MYCOSTATIN) powder Apply 100,000 Units topically as needed.       . verapamil (COVERA HS) 240 MG (CO) 24 hr tablet Take 240 mg by mouth at bedtime.           History   Social History  . Marital Status: Divorced    Spouse Name: N/A    Number of Children: N/A  . Years of Education: N/A   Occupational History  . retired    Social History Main Topics  . Smoking status: Former Smoker -- 1.0 packs/day for 30 years    Quit date: 10/10/1985  . Smokeless tobacco: Not on file  . Alcohol Use: No  . Drug Use: No  . Sexually Active: Not on file   Other Topics Concern  . Not on file   Social History Narrative  . No narrative on file    No family history on file.  Past Medical History  Diagnosis Date  . Dyslipidemia   . Bradycardia   . Overweight   . IBS (irritable bowel syndrome)   . Hemorrhoids   . Hx of colonic polyps   . Herpes simplex without mention of complication   . History of skin cancer   . Chest pain     Nuclear, May, 2010, no scar or ischemia  . Ejection fraction     EF 60%, May, 2010, echo  .  Tachycardia     Rapid heart beat, October, 2011, 21 day monitor, sinus rhythm, patient senses mild increase in heart rate, low-dose beta blockade try    Past Surgical History  Procedure Date  . Cataract extraction   . Total abdominal hysterectomy w/ bilateral salpingoophorectomy   . Appendectomy   . Tonsillectomy     ROS   Patient denies fever, chills, sweats, rash, change in vision, change in hearing, chest pain, cough, nausea vomiting, urinary symptoms.All other systems are reviewed and are negative.  PHYSICAL EXAM   Patient is stable.  She is overweight.  She is oriented to person time and place.  Affect is normal.  Head is atraumatic.  There is no jugular distention.  Lungs are clear.  Respiratory effort is not labored.  Cardiac exam reveals S1 and S2.  There are no clicks or significant murmurs.  The abdomen is soft.  There is no significant peripheral edema at this time.  Filed Vitals:   09/14/11 1446  BP: 124/74  Pulse: 71  Height: 5' 2.5" (1.588 m)  Weight: 236 lb (107.049 kg)    EKG  EKG is done today  and reviewed by me.  She has sinus rhythm.  There is mild increase in voltage.  There are no significant abnormalities. ASSESSMENT & PLAN

## 2011-09-14 NOTE — Assessment & Plan Note (Signed)
The patient has not had any recurrent significant chest pain.  No further workup is needed.

## 2011-09-20 ENCOUNTER — Other Ambulatory Visit: Payer: Self-pay

## 2011-09-20 NOTE — Progress Notes (Signed)
Addended by: Erin Hearing on: 09/20/2011 04:42 PM   Modules accepted: Orders

## 2011-12-27 DIAGNOSIS — C8400 Mycosis fungoides, unspecified site: Secondary | ICD-10-CM | POA: Insufficient documentation

## 2011-12-27 DIAGNOSIS — R32 Unspecified urinary incontinence: Secondary | ICD-10-CM | POA: Insufficient documentation

## 2011-12-27 DIAGNOSIS — I10 Essential (primary) hypertension: Secondary | ICD-10-CM | POA: Insufficient documentation

## 2012-05-03 ENCOUNTER — Encounter (HOSPITAL_COMMUNITY): Payer: Self-pay

## 2012-05-03 ENCOUNTER — Emergency Department (INDEPENDENT_AMBULATORY_CARE_PROVIDER_SITE_OTHER)
Admission: EM | Admit: 2012-05-03 | Discharge: 2012-05-03 | Disposition: A | Discharge status: Home / Self Care | Payer: Medicare Other | Source: Home / Self Care | Attending: Emergency Medicine | Admitting: Emergency Medicine

## 2012-05-03 DIAGNOSIS — J45909 Unspecified asthma, uncomplicated: Secondary | ICD-10-CM

## 2012-05-03 HISTORY — DX: Cardiac murmur, unspecified: R01.1

## 2012-05-03 MED ORDER — AZITHROMYCIN 250 MG PO TABS: ORAL_TABLET | ORAL | Status: AC

## 2012-05-03 MED ORDER — PREDNISONE 5 MG PO KIT: 1.0000 | PACK | Freq: Every day | ORAL | Status: DC

## 2012-05-03 MED ORDER — ALBUTEROL SULFATE HFA 108 (90 BASE) MCG/ACT IN AERS: 1.0000 | INHALATION_SPRAY | Freq: Four times a day (QID) | RESPIRATORY_TRACT | Status: DC | PRN

## 2012-05-03 MED ORDER — BENZONATATE 200 MG PO CAPS: 200.0000 mg | ORAL_CAPSULE | Freq: Three times a day (TID) | ORAL | Status: AC | PRN

## 2012-05-03 NOTE — ED Provider Notes (Signed)
Chief Complaint  Patient presents with  . Cough  . Chills  . URI    History of Present Illness:   [name] is a 71 year old female with a five-day history of nasal congestion, watery eyes, sinus pressure, clear rhinorrhea, and malaise and fatigue. She then developed sneezing and cough productive of yellow-green sputum. She noted rattling in her chest and some expiratory wheezing. She was hoarse and had a sore throat. She felt chilled and feverish. She denies any GI symptoms such as abdominal pain, nausea, vomiting, or diarrhea.  Review of Systems:  Other than noted above, the patient denies any of the following symptoms. Systemic:  No fever, chills, sweats, fatigue, myalgias, headache, or anorexia. Eye:  No redness, pain or drainage. ENT:  No earache, ear congestion, nasal congestion, sneezing, rhinorrhea, sinus pressure, sinus pain, post nasal drip, or sore throat. Lungs:  No cough, sputum production, wheezing, shortness of breath, or chest pain. GI:  No abdominal pain, nausea, vomiting, or diarrhea. Skin:  No rash or itching.  PMFSH:  Past medical history, family history, social history, meds, and allergies were reviewed.  Physical Exam:   Vital signs:  BP 137/82  Pulse 68  Temp 99.3 F (37.4 C) (Oral)  Resp 22  SpO2 95% General:  Alert, in no distress. Eye:  No conjunctival injection or drainage. Lids were normal. ENT:  TMs and canals were normal, without erythema or inflammation.  Nasal mucosa was clear and uncongested, without drainage.  Mucous membranes were moist.  Pharynx was clear, without exudate or drainage.  There were no oral ulcerations or lesions. Neck:  Supple, no adenopathy, tenderness or mass. Lungs:  No respiratory distress.  Breath sounds were equal bilaterally and she has bilateral expiratory wheezes and rhonchi. Lungs were resonant to percussion.  No egophony. Heart:  Regular rhythm, without gallops, or rubs. She did have a grade 3/6 systolic ejection murmur  loudest at the upper right sternal border. Skin:  Clear, warm, and dry, without rash or lesions.  Assessment:  The encounter diagnosis was Asthmatic bronchitis.  Plan:   1.  The following meds were prescribed:   New Prescriptions   ALBUTEROL (PROVENTIL HFA;VENTOLIN HFA) 108 (90 BASE) MCG/ACT INHALER    Inhale 1-2 puffs into the lungs every 6 (six) hours as needed for wheezing.   AZITHROMYCIN (ZITHROMAX Z-PAK) 250 MG TABLET    Take as directed.   BENZONATATE (TESSALON) 200 MG CAPSULE    Take 1 capsule (200 mg total) by mouth 3 (three) times daily as needed for cough.   PREDNISONE 5 MG KIT    Take 1 kit (5 mg total) by mouth daily after breakfast. Prednisone 5 mg 6 day dosepack.  Take as directed.   2.  The patient was instructed in symptomatic care and handouts were given. 3.  The patient was told to return if becoming worse in any way, if no better in 3 or 4 days, and given some red flag symptoms that would indicate earlier return.   Reuben Likes, MD 05/03/12 [phone]

## 2012-05-03 NOTE — ED Notes (Signed)
C/o pressure around her eyes, feeling weak, sneezing, runny nose, prod. cough, sore throat, chills, hoarseness and chest congestion.  Sx started on Sunday.

## 2012-09-20 ENCOUNTER — Ambulatory Visit: Payer: Medicare Other | Admitting: Cardiology

## 2012-10-04 ENCOUNTER — Ambulatory Visit (INDEPENDENT_AMBULATORY_CARE_PROVIDER_SITE_OTHER): Payer: Medicare Other | Admitting: Cardiology

## 2012-10-04 ENCOUNTER — Encounter: Payer: Self-pay | Admitting: Cardiology

## 2012-10-04 VITALS — BP 124/72 | HR 80 | Ht 62.5 in | Wt 243.4 lb

## 2012-10-04 DIAGNOSIS — I498 Other specified cardiac arrhythmias: Secondary | ICD-10-CM

## 2012-10-04 DIAGNOSIS — R000 Tachycardia, unspecified: Secondary | ICD-10-CM

## 2012-10-04 DIAGNOSIS — R079 Chest pain, unspecified: Secondary | ICD-10-CM

## 2012-10-04 DIAGNOSIS — R002 Palpitations: Secondary | ICD-10-CM

## 2012-10-04 NOTE — Assessment & Plan Note (Signed)
She is not having any significant recurrent chest pain. No further workup.

## 2012-10-04 NOTE — Assessment & Plan Note (Signed)
Heart rate is stable. No further workup.

## 2012-10-04 NOTE — Patient Instructions (Addendum)
Your physician wants you to follow-up in: 1 year. You will receive a reminder letter in the mail two months in advance. If you don't receive a letter, please call our office to schedule the follow-up appointment.  

## 2012-10-04 NOTE — Progress Notes (Signed)
Patient ID: [name], female   DOB: Jul 15, 1941, 71 y.o.   MRN: [id number]   HPI  Patient is seen for cardiology followup. I saw her last December, 2012. She's had some chest discomfort in the past but no proof of coronary disease. She has normal left ventricle function. She's followed carefully by Dr. Chilton Si. She has been stable. Her metoprolol dose was increased in the past few months. She has vague chest tightness and shortness of breath at times. There no significant exertional symptoms.  No Known Allergies  Current Outpatient Prescriptions  Medication Sig Dispense Refill  . acetaminophen (TYLENOL) 500 MG tablet Take 500 mg by mouth every 6 (six) hours as needed.        . clobetasol cream (TEMOVATE) 0.05 % Apply 1 application topically. Every other week       . docusate sodium (COLACE) 100 MG capsule Take 100 mg by mouth as needed.        . furosemide (LASIX) 40 MG tablet Take 20 mg by mouth daily.       . metoprolol succinate (TOPROL-XL) 25 MG 24 hr tablet Take 25 mg by mouth daily. Pt states she take 1 tablet daily      . Mirabegron (MYRBETRIQ PO) Take by mouth daily.      . Polyethylene Glycol 3350 (MIRALAX PO) Take by mouth.      . verapamil (COVERA HS) 240 MG (CO) 24 hr tablet Take 240 mg by mouth at bedtime.        Marland Kitchen albuterol (PROVENTIL HFA;VENTOLIN HFA) 108 (90 BASE) MCG/ACT inhaler Inhale 1-2 puffs into the lungs every 6 (six) hours as needed for wheezing.  1 Inhaler  0    History   Social History  . Marital Status: Divorced    Spouse Name: N/A    Number of Children: N/A  . Years of Education: N/A   Occupational History  . retired    Social History Main Topics  . Smoking status: Former Smoker -- 1.0 packs/day for 30 years    Quit date: 10/10/1985  . Smokeless tobacco: Not on file  . Alcohol Use: No  . Drug Use: No  . Sexually Active: Not on file   Other Topics Concern  . Not on file   Social History Narrative  . No narrative on file    No family  history on file.  Past Medical History  Diagnosis Date  . Dyslipidemia   . Bradycardia   . Overweight   . IBS (irritable bowel syndrome)   . Hemorrhoids   . Hx of colonic polyps   . Herpes simplex without mention of complication   . History of skin cancer   . Chest pain     Nuclear, May, 2010, no scar or ischemia  . Ejection fraction     EF 60%, May, 2010, echo  . Tachycardia     Rapid heart beat, October, 2011, 21 day monitor, sinus rhythm, patient senses mild increase in heart rate, low-dose beta blockade try  . Heart murmur     Past Surgical History  Procedure Date  . Cataract extraction   . Total abdominal hysterectomy w/ bilateral salpingoophorectomy   . Appendectomy   . Tonsillectomy     Patient Active Problem List  Diagnosis  . HERPES SIMPLEX INFECTION, TYPE I  . DYSLIPIDEMIA  . OBESITY, MORBID  . BRADYCARDIA  . HEMORRHOIDS  . IBS  . PALPITATIONS  . SKIN CANCER, HX OF  . COLONIC POLYPS, HX  OF  . Chest pain  . Ejection fraction  . Tachycardia  . Dyslipidemia    ROS   Patient denies fever, chills, headache, sweats, rash, change in vision, change in hearing, chest pain, cough, nausea vomiting, urinary symptoms. All other systems are reviewed and are negative.  PHYSICAL EXAM  Patient is overweight. She is oriented to person time and place. Affect is normal. There is no jugulovenous distention. Lungs are clear. Respiratory effort is nonlabored. Cardiac exam reveals S1 and S2. There no clicks or significant murmurs. Abdomen is soft. There is no peripheral edema.  Filed Vitals:   10/04/12 1507  BP: 124/72  Pulse: 80  Height: 5' 2.5" (1.588 m)  Weight: 243 lb 6.4 oz (110.406 kg)  SpO2: 99%    EKG is done today and reviewed by me. There is no significant change from the past. There is normal sinus rhythm. ASSESSMENT & PLAN

## 2012-10-04 NOTE — Assessment & Plan Note (Signed)
She's not having any significant palpitations. No further workup. 

## 2012-10-04 NOTE — Assessment & Plan Note (Signed)
She has good heart rate control with beta blockade.

## 2014-06-27 ENCOUNTER — Encounter: Payer: Self-pay | Admitting: Internal Medicine

## 2014-10-11 ENCOUNTER — Encounter (HOSPITAL_COMMUNITY): Payer: Self-pay | Admitting: Emergency Medicine

## 2014-10-11 ENCOUNTER — Emergency Department (INDEPENDENT_AMBULATORY_CARE_PROVIDER_SITE_OTHER)
Admission: EM | Admit: 2014-10-11 | Discharge: 2014-10-11 | Disposition: A | Discharge status: Home / Self Care | Payer: BC Managed Care – PPO | Source: Home / Self Care | Attending: Family Medicine | Admitting: Family Medicine

## 2014-10-11 DIAGNOSIS — J40 Bronchitis, not specified as acute or chronic: Secondary | ICD-10-CM

## 2014-10-11 MED ORDER — IPRATROPIUM-ALBUTEROL 0.5-2.5 (3) MG/3ML IN SOLN
RESPIRATORY_TRACT | Status: AC
  Filled 2014-10-11: qty 3

## 2014-10-11 MED ORDER — GUAIFENESIN-CODEINE 100-10 MG/5ML PO SOLN
5.0000 mL | Freq: Every evening | ORAL | Status: DC | PRN
Start: 2014-10-11 — End: 2017-11-24

## 2014-10-11 MED ORDER — IPRATROPIUM-ALBUTEROL 0.5-2.5 (3) MG/3ML IN SOLN
3.0000 mL | Freq: Once | RESPIRATORY_TRACT | Status: AC
  Administered 2014-10-11: 3 mL via RESPIRATORY_TRACT

## 2014-10-11 MED ORDER — IPRATROPIUM BROMIDE 0.06 % NA SOLN
2.0000 | Freq: Four times a day (QID) | NASAL | Status: DC
Start: 2014-10-11 — End: 2018-08-31

## 2014-10-11 MED ORDER — PREDNISONE 10 MG PO TABS: 30.0000 mg | ORAL_TABLET | Freq: Every day | ORAL | Status: DC

## 2014-10-11 MED ORDER — ALBUTEROL SULFATE HFA 108 (90 BASE) MCG/ACT IN AERS: 2.0000 | INHALATION_SPRAY | Freq: Four times a day (QID) | RESPIRATORY_TRACT | Status: DC | PRN

## 2014-10-11 NOTE — Discharge Instructions (Signed)
Thank you for coming in today. Call or go to the emergency room if you get worse, have trouble breathing, have chest pains, or palpitations.  Do not drive after taking codeine  Acute Bronchitis Bronchitis is inflammation of the airways that extend from the windpipe into the lungs (bronchi). The inflammation often causes mucus to develop. This leads to a cough, which is the most common symptom of bronchitis.  In acute bronchitis, the condition usually develops suddenly and goes away over time, usually in a couple weeks. Smoking, allergies, and asthma can make bronchitis worse. Repeated episodes of bronchitis may cause further lung problems.  CAUSES Acute bronchitis is most often caused by the same virus that causes a cold. The virus can spread from person to person (contagious) through coughing, sneezing, and touching contaminated objects. SIGNS AND SYMPTOMS   Cough.   Fever.   Coughing up mucus.   Body aches.   Chest congestion.   Chills.   Shortness of breath.   Sore throat.  DIAGNOSIS  Acute bronchitis is usually diagnosed through a physical exam. Your health care provider will also ask you questions about your medical history. Tests, such as chest X-rays, are sometimes done to rule out other conditions.  TREATMENT  Acute bronchitis usually goes away in a couple weeks. Oftentimes, no medical treatment is necessary. Medicines are sometimes given for relief of fever or cough. Antibiotic medicines are usually not needed but may be prescribed in certain situations. In some cases, an inhaler may be recommended to help reduce shortness of breath and control the cough. A cool mist vaporizer may also be used to help thin bronchial secretions and make it easier to clear the chest.  HOME CARE INSTRUCTIONS  Get plenty of rest.   Drink enough fluids to keep your urine clear or pale yellow (unless you have a medical condition that requires fluid restriction). Increasing fluids may  help thin your respiratory secretions (sputum) and reduce chest congestion, and it will prevent dehydration.   Take medicines only as directed by your health care provider.  If you were prescribed an antibiotic medicine, finish it all even if you start to feel better.  Avoid smoking and secondhand smoke. Exposure to cigarette smoke or irritating chemicals will make bronchitis worse. If you are a smoker, consider using nicotine gum or skin patches to help control withdrawal symptoms. Quitting smoking will help your lungs heal faster.   Reduce the chances of another bout of acute bronchitis by washing your hands frequently, avoiding people with cold symptoms, and trying not to touch your hands to your mouth, nose, or eyes.   Keep all follow-up visits as directed by your health care provider.  SEEK MEDICAL CARE IF: Your symptoms do not improve after 1 week of treatment.  SEEK IMMEDIATE MEDICAL CARE IF:  You develop an increased fever or chills.   You have chest pain.   You have severe shortness of breath.  You have bloody sputum.   You develop dehydration.  You faint or repeatedly feel like you are going to pass out.  You develop repeated vomiting.  You develop a severe headache. MAKE SURE YOU:   Understand these instructions.  Will watch your condition.  Will get help right away if you are not doing well or get worse. Document Released: 11/03/2004 Document Revised: 02/10/2014 Document Reviewed: 03/19/2013 [facility] Patient Information 2015 [place], [place]. This information is not intended to replace advice given to you by your health care provider. Make sure you discuss  any questions you have with your health care provider. ° °

## 2014-10-11 NOTE — ED Notes (Signed)
C/o cold sx onset 3-4 days Sx include: runny nose, cough, sneezing, wheezing, congestion, weak, fevers Taking cough drops and drinking lots of fluids w/no relief Alert, no signs of acute distress.

## 2014-10-11 NOTE — ED Provider Notes (Signed)
[name] is a 74 y.o. female who presents to Urgent Care today for Raynaud's cough congestion sneezing wheezing and mild fever. Symptoms present for 3-4 days. No vomiting or diarrhea. Patient has tried cough drops which helped a little.   Past Medical History  Diagnosis Date  . Dyslipidemia   . Bradycardia   . Overweight(278.02)   . IBS (irritable bowel syndrome)   . Hemorrhoids   . Hx of colonic polyps   . Herpes simplex without mention of complication   . History of skin cancer   . Chest pain     Nuclear, May, 2010, no scar or ischemia  . Ejection fraction     EF 60%, May, 2010, echo  . Tachycardia     Rapid heart beat, October, 2011, 21 day monitor, sinus rhythm, patient senses mild increase in heart rate, low-dose beta blockade try  . Heart murmur    Past Surgical History  Procedure Laterality Date  . Cataract extraction    . Total abdominal hysterectomy w/ bilateral salpingoophorectomy    . Appendectomy    . Tonsillectomy     History  Substance Use Topics  . Smoking status: Former Smoker -- 1.00 packs/day for 30 years    Quit date: 10/10/1985  . Smokeless tobacco: Not on file  . Alcohol Use: No   ROS as above Medications: No current facility-administered medications for this encounter.   Current Outpatient Prescriptions  Medication Sig Dispense Refill  . furosemide (LASIX) 40 MG tablet Take 20 mg by mouth daily.     . metoprolol succinate (TOPROL-XL) 25 MG 24 hr tablet Take 25 mg by mouth daily. Pt states she take 1 tablet daily    . verapamil (COVERA HS) 240 MG (CO) 24 hr tablet Take 240 mg by mouth at bedtime.      Marland Kitchen acetaminophen (TYLENOL) 500 MG tablet Take 500 mg by mouth every 6 (six) hours as needed.      Marland Kitchen albuterol (PROVENTIL HFA;VENTOLIN HFA) 108 (90 BASE) MCG/ACT inhaler Inhale 2 puffs into the lungs every 6 (six) hours as needed for wheezing or shortness of breath. 1 Inhaler 2  . clobetasol cream (TEMOVATE) 1.61 % Apply 1 application  topically. Every other week     . docusate sodium (COLACE) 100 MG capsule Take 100 mg by mouth as needed.      Marland Kitchen guaiFENesin-codeine 100-10 MG/5ML syrup Take 5 mLs by mouth at bedtime as needed for cough. 120 mL 0  . ipratropium (ATROVENT) 0.06 % nasal spray Place 2 sprays into both nostrils 4 (four) times daily. 15 mL 1  . Mirabegron (MYRBETRIQ PO) Take by mouth daily.    . Polyethylene Glycol 3350 (MIRALAX PO) Take by mouth.    . predniSONE (DELTASONE) 10 MG tablet Take 3 tablets (30 mg total) by mouth daily. 15 tablet 0   No Known Allergies   Exam:  BP 151/78 mmHg  Pulse 59  Temp(Src) 98.7 F (37.1 C) (Oral)  Resp 12  SpO2 98% Gen: Well NAD HEENT: EOMI,  MMM posterior pharynx with cobblestoning. Normal tympanic membranes bilaterally. Clear nasal discharge present. Lungs: Normal work of breathing. Coarse breath sounds throughout Heart: RRR no MRG Abd: NABS, Soft. Nondistended, Nontender Exts: Brisk capillary refill, warm and well perfused.   Patient was given a 2.5/0.5 mg DuoNeb nebulizer treatment and felt a little better.  No results found for this or any previous visit (from the past 24 hour(s)). No results found.  Assessment and Plan: 74  y.o. female with bronchitis. Treatment with albuterol Atrovent nasal spray codeine containing cough medication and prednisone.  Discussed warning signs or symptoms. Please see discharge instructions. Patient expresses understanding.     Gregor Hams, MD 10/11/14 1800

## 2015-04-22 ENCOUNTER — Other Ambulatory Visit (HOSPITAL_COMMUNITY): Payer: Self-pay | Admitting: Internal Medicine

## 2015-04-22 DIAGNOSIS — E041 Nontoxic single thyroid nodule: Secondary | ICD-10-CM

## 2015-05-01 ENCOUNTER — Ambulatory Visit (HOSPITAL_COMMUNITY)
Admission: RE | Admit: 2015-05-01 | Discharge: 2015-05-01 | Disposition: A | Discharge status: Home / Self Care | Payer: Medicare Other | Source: Ambulatory Visit | Attending: Internal Medicine | Admitting: Internal Medicine

## 2015-05-01 DIAGNOSIS — E041 Nontoxic single thyroid nodule: Secondary | ICD-10-CM | POA: Insufficient documentation

## 2015-08-05 ENCOUNTER — Other Ambulatory Visit: Payer: Self-pay | Admitting: Internal Medicine

## 2015-08-05 ENCOUNTER — Ambulatory Visit
Admission: RE | Admit: 2015-08-05 | Discharge: 2015-08-05 | Disposition: A | Discharge status: Home / Self Care | Payer: Medicare Other | Source: Ambulatory Visit | Attending: Internal Medicine | Admitting: Internal Medicine

## 2015-08-05 DIAGNOSIS — R221 Localized swelling, mass and lump, neck: Secondary | ICD-10-CM

## 2015-11-10 ENCOUNTER — Encounter (HOSPITAL_COMMUNITY): Payer: Self-pay | Admitting: Emergency Medicine

## 2015-11-10 ENCOUNTER — Emergency Department (INDEPENDENT_AMBULATORY_CARE_PROVIDER_SITE_OTHER)
Admission: EM | Admit: 2015-11-10 | Discharge: 2015-11-10 | Disposition: A | Discharge status: Nursing Home (Includes ICF) | Payer: Medicare Other | Source: Home / Self Care

## 2015-11-10 ENCOUNTER — Emergency Department (HOSPITAL_COMMUNITY)
Admission: EM | Admit: 2015-11-10 | Discharge: 2015-11-11 | Disposition: A | Discharge status: Home / Self Care | Payer: Medicare Other | Attending: Emergency Medicine | Admitting: Emergency Medicine

## 2015-11-10 DIAGNOSIS — Z8619 Personal history of other infectious and parasitic diseases: Secondary | ICD-10-CM | POA: Diagnosis not present

## 2015-11-10 DIAGNOSIS — S8001XA Contusion of right knee, initial encounter: Secondary | ICD-10-CM | POA: Diagnosis not present

## 2015-11-10 DIAGNOSIS — R600 Localized edema: Secondary | ICD-10-CM

## 2015-11-10 DIAGNOSIS — S299XXA Unspecified injury of thorax, initial encounter: Secondary | ICD-10-CM | POA: Diagnosis not present

## 2015-11-10 DIAGNOSIS — Z85828 Personal history of other malignant neoplasm of skin: Secondary | ICD-10-CM | POA: Diagnosis not present

## 2015-11-10 DIAGNOSIS — M79604 Pain in right leg: Secondary | ICD-10-CM

## 2015-11-10 DIAGNOSIS — Z87891 Personal history of nicotine dependence: Secondary | ICD-10-CM | POA: Insufficient documentation

## 2015-11-10 DIAGNOSIS — S8991XA Unspecified injury of right lower leg, initial encounter: Secondary | ICD-10-CM | POA: Diagnosis not present

## 2015-11-10 DIAGNOSIS — W010XXA Fall on same level from slipping, tripping and stumbling without subsequent striking against object, initial encounter: Secondary | ICD-10-CM | POA: Diagnosis not present

## 2015-11-10 DIAGNOSIS — R0602 Shortness of breath: Secondary | ICD-10-CM | POA: Insufficient documentation

## 2015-11-10 DIAGNOSIS — R011 Cardiac murmur, unspecified: Secondary | ICD-10-CM | POA: Diagnosis not present

## 2015-11-10 DIAGNOSIS — Y92129 Unspecified place in nursing home as the place of occurrence of the external cause: Secondary | ICD-10-CM | POA: Insufficient documentation

## 2015-11-10 DIAGNOSIS — E663 Overweight: Secondary | ICD-10-CM | POA: Diagnosis not present

## 2015-11-10 DIAGNOSIS — Y999 Unspecified external cause status: Secondary | ICD-10-CM | POA: Insufficient documentation

## 2015-11-10 DIAGNOSIS — Y9389 Activity, other specified: Secondary | ICD-10-CM | POA: Insufficient documentation

## 2015-11-10 DIAGNOSIS — M25561 Pain in right knee: Secondary | ICD-10-CM | POA: Diagnosis not present

## 2015-11-10 DIAGNOSIS — Z8719 Personal history of other diseases of the digestive system: Secondary | ICD-10-CM | POA: Diagnosis not present

## 2015-11-10 DIAGNOSIS — Z86010 Personal history of colonic polyps: Secondary | ICD-10-CM | POA: Insufficient documentation

## 2015-11-10 DIAGNOSIS — M7989 Other specified soft tissue disorders: Secondary | ICD-10-CM

## 2015-11-10 DIAGNOSIS — M79609 Pain in unspecified limb: Secondary | ICD-10-CM | POA: Diagnosis not present

## 2015-11-10 DIAGNOSIS — Z79899 Other long term (current) drug therapy: Secondary | ICD-10-CM | POA: Diagnosis not present

## 2015-11-10 LAB — COMPREHENSIVE METABOLIC PANEL
ALT: 11 U/L — AB (ref 14–54)
AST: 16 U/L (ref 15–41)
Albumin: 3.5 g/dL (ref 3.5–5.0)
Alkaline Phosphatase: 78 U/L (ref 38–126)
Anion gap: 12 (ref 5–15)
BUN: 15 mg/dL (ref 6–20)
CHLORIDE: 103 mmol/L (ref 101–111)
CO2: 26 mmol/L (ref 22–32)
CREATININE: 1.21 mg/dL — AB (ref 0.44–1.00)
Calcium: 9.7 mg/dL (ref 8.9–10.3)
GFR calc Af Amer: 51 mL/min — ABNORMAL LOW (ref >60)
GFR calc non Af Amer: 44 mL/min — ABNORMAL LOW (ref >60)
Glucose, Bld: 105 mg/dL — ABNORMAL HIGH (ref 65–99)
Potassium: 4 mmol/L (ref 3.5–5.1)
SODIUM: 141 mmol/L (ref 135–145)
Total Bilirubin: 0.5 mg/dL (ref 0.3–1.2)
Total Protein: 6.7 g/dL (ref 6.5–8.1)

## 2015-11-10 LAB — CBC WITH DIFFERENTIAL/PLATELET
BASOS ABS: 0 10*3/uL (ref 0.0–0.1)
Basophils Relative: 0 %
EOS ABS: 0.2 10*3/uL (ref 0.0–0.7)
EOS PCT: 2 %
HCT: 37.7 % (ref 36.0–46.0)
Hemoglobin: 12.6 g/dL (ref 12.0–15.0)
LYMPHS PCT: 28 %
Lymphs Abs: 2.7 10*3/uL (ref 0.7–4.0)
MCH: 30.9 pg (ref 26.0–34.0)
MCHC: 33.4 g/dL (ref 30.0–36.0)
MCV: 92.4 fL (ref 78.0–100.0)
Monocytes Absolute: 0.6 10*3/uL (ref 0.1–1.0)
Monocytes Relative: 7 %
NEUTROS PCT: 63 %
Neutro Abs: 5.8 10*3/uL (ref 1.7–7.7)
PLATELETS: 188 10*3/uL (ref 150–400)
RBC: 4.08 MIL/uL (ref 3.87–5.11)
RDW: 13.5 % (ref 11.5–15.5)
WBC: 9.3 10*3/uL (ref 4.0–10.5)

## 2015-11-10 MED ORDER — OXYCODONE-ACETAMINOPHEN 5-325 MG PO TABS
1.0000 | ORAL_TABLET | Freq: Once | ORAL | Status: AC
  Administered 2015-11-11: 1 via ORAL
  Filled 2015-11-10: qty 1

## 2015-11-10 NOTE — ED Provider Notes (Signed)
CSN: [id number]     Arrival date & time 11/10/15  1848 History   None    Chief Complaint  Patient presents with  . Leg Swelling   (Consider location/radiation/quality/duration/timing/severity/associated sxs/prior Treatment) HPI Comments: Pt states she started having worsening right lower leg swelling, no trauma or injury, worse with standing/movement. + erythema, + edema, +warmth to touch, "feels like knot in back" and points to calf area. Pt states on her way to UC tonight she [facility], but heard pop in right knee(affected area), had pain and swelling prior to fall this evening.   Patient is a 75 y.o. female presenting with leg pain. The history is provided by the patient. No language interpreter was used.  Leg Pain Location:  Leg Injury: no   Leg location:  R lower leg Pain details:    Quality:  Pressure and aching   Radiates to: right popliteal/calf area.   Severity:  Moderate (6-7/10)   Onset quality:  Sudden   Timing:  Constant   Progression:  Worsening Chronicity:  New Dislocation: no   Foreign body present:  No foreign bodies Tetanus status:  Up to date Prior injury to area:  Unable to specify Relieved by:  Rest Worsened by:  Bearing weight and activity Associated symptoms: swelling   Risk factors: obesity     Past Medical History  Diagnosis Date  . Dyslipidemia   . Bradycardia   . Overweight(278.02)   . IBS (irritable bowel syndrome)   . Hemorrhoids   . Hx of colonic polyps   . Herpes simplex without mention of complication   . History of skin cancer   . Chest pain     Nuclear, May, 2010, no scar or ischemia  . Ejection fraction     EF 60%, May, 2010, echo  . Tachycardia     Rapid heart beat, October, 2011, 21 day monitor, sinus rhythm, patient senses mild increase in heart rate, low-dose beta blockade try  . Heart murmur    Past Surgical History  Procedure Laterality Date  . Cataract extraction    . Total abdominal  hysterectomy w/ bilateral salpingoophorectomy    . Appendectomy    . Tonsillectomy     No family history on file. Social History  Substance Use Topics  . Smoking status: Former Smoker -- 1.00 packs/day for 30 years    Quit date: 10/10/1985  . Smokeless tobacco: None  . Alcohol Use: No   OB History    No data available     Review of Systems  Eyes: Negative.   Respiratory: Negative.   Cardiovascular: Positive for leg swelling. Negative for chest pain and palpitations.  Gastrointestinal: Negative.   Endocrine: Negative.   Genitourinary: Negative.   Musculoskeletal: Positive for myalgias and gait problem.  Skin: Positive for color change. Negative for wound.  Allergic/Immunologic: Negative.   Neurological: Negative.   Hematological: Negative.   Psychiatric/Behavioral: Negative.   All other systems reviewed and are negative.   Allergies  Review of patient's allergies indicates no known allergies.  Home Medications   Prior to Admission medications   Medication Sig Start Date End Date Taking? Authorizing Provider  furosemide (LASIX) 40 MG tablet Take 20 mg by mouth daily.    Yes Historical Provider, MD  metoprolol succinate (TOPROL-XL) 25 MG 24 hr tablet Take 25 mg by mouth daily. Pt states she take 1 tablet daily   Yes Historical Provider, MD  Mirabegron (MYRBETRIQ PO) Take by mouth  daily.   Yes Historical Provider, MD  verapamil (COVERA HS) 240 MG (CO) 24 hr tablet Take 240 mg by mouth at bedtime.     Yes Historical Provider, MD  acetaminophen (TYLENOL) 500 MG tablet Take 500 mg by mouth every 6 (six) hours as needed.      Historical Provider, MD  albuterol (PROVENTIL HFA;VENTOLIN HFA) 108 (90 BASE) MCG/ACT inhaler Inhale 2 puffs into the lungs every 6 (six) hours as needed for wheezing or shortness of breath. 10/11/14   Gregor Hams, MD  clobetasol cream (TEMOVATE) [id number] % Apply 1 application topically. Every other week     Historical Provider, MD  docusate sodium (COLACE) 100  MG capsule Take 100 mg by mouth as needed.      Historical Provider, MD  guaiFENesin-codeine 100-10 MG/5ML syrup Take 5 mLs by mouth at bedtime as needed for cough. 10/11/14   Gregor Hams, MD  ipratropium (ATROVENT) 0.06 % nasal spray Place 2 sprays into both nostrils 4 (four) times daily. 10/11/14   Gregor Hams, MD  Polyethylene Glycol 3350 (MIRALAX PO) Take by mouth.    Historical Provider, MD  predniSONE (DELTASONE) 10 MG tablet Take 3 tablets (30 mg total) by mouth daily. 10/11/14   Gregor Hams, MD   Meds Ordered and Administered this Visit  Medications - No data to display  BP 150/65 mmHg  Pulse 60  Temp(Src) 98.4 F (36.9 C) (Oral)  Resp 24  SpO2 100% No data found.   Physical Exam  Constitutional: She is oriented to person, place, and time. She appears well-developed and well-nourished. She is active and cooperative. She appears distressed.  HENT:  Head: Normocephalic.  Eyes: Pupils are equal, round, and reactive to light.  Neck: Normal range of motion.  Cardiovascular: Normal rate, regular rhythm, normal heart sounds, intact distal pulses and normal pulses.   Pulses:      Radial pulses are 2+ on the right side, and 2+ on the left side.       Dorsalis pedis pulses are 2+ on the right side, and 2+ on the left side.  Pulmonary/Chest: Effort normal.  Musculoskeletal: Normal range of motion. She exhibits edema and tenderness.       Right lower leg: She exhibits tenderness and edema. She exhibits no bony tenderness, no deformity and no laceration.  Neurological: She is alert and oriented to person, place, and time. No cranial nerve deficit or sensory deficit. GCS eye subscore is 4. GCS verbal subscore is 5. GCS motor subscore is 6.  Skin: Skin is warm and dry. There is erythema.     Psychiatric: She has a normal mood and affect. Her speech is normal and behavior is normal.  Nursing note and vitals reviewed.   ED Course  Procedures (including critical care time)  Labs  Review Labs Reviewed - No data to display  Imaging Review No results found.      MDM   1. Leg edema, right   2. Right leg pain    Go to [facility] ER for further evaluation (ddx: DVT, PVD), do not eat or drink. Patient verbalized understanding to this provider  Tori Milks, NP [id number]

## 2015-11-10 NOTE — Discharge Instructions (Signed)
Go straight to Er for further evaluation of acute right lower leg pain/swelling (ddx: DVT, PVD, Baker's cyst). Do not eat or drink anything.

## 2015-11-10 NOTE — ED Notes (Signed)
Pt. reports right leg swelling with pain onset last week , denies injury , pt. also fell this evening while trying to get up from sitting position , denies LOC /ambulatory .

## 2015-11-10 NOTE — ED Provider Notes (Signed)
CSN: [id number]     Arrival date & time 11/10/15  2011 History  By signing my name below, I, [name], attest that this documentation has been prepared under the direction and in the presence of Annette Hacker, MD. Electronically Signed: Altamease Richardson, ED Scribe. 11/11/2015. 12:19 AM   Chief Complaint  Patient presents with  . Leg Swelling   The history is provided by the patient. No language interpreter was used.   [name] is a 75 y.o. female with history of dyslipidemia who presents to the Emergency Department complaining of constant, 9/10 in severity, right leg pain that worsened tonight after she tripped and fell to her right knee. Pt states that she has had bilateral LE pain and swelling (R>L) for some time but the right leg pain worsened after a fall tonight at her mother's nursing home. No head injury or LOC. No history of DVT/PE or CHF. Pt denies any recent long travel, surgery, or hospitalization. Patient endorses occasional shortness of breath which is not necessarily new. Denies chest pain.  Pt was seen at Urgent Care tonight for her symptoms and referred to the ED for further evaluation.    Past Medical History  Diagnosis Date  . Dyslipidemia   . Bradycardia   . Overweight(278.02)   . IBS (irritable bowel syndrome)   . Hemorrhoids   . Hx of colonic polyps   . Herpes simplex without mention of complication   . History of skin cancer   . Chest pain     Nuclear, May, 2010, no scar or ischemia  . Ejection fraction     EF 60%, May, 2010, echo  . Tachycardia     Rapid heart beat, October, 2011, 21 day monitor, sinus rhythm, patient senses mild increase in heart rate, low-dose beta blockade try  . Heart murmur    Past Surgical History  Procedure Laterality Date  . Cataract extraction    . Total abdominal hysterectomy w/ bilateral salpingoophorectomy    . Appendectomy    . Tonsillectomy     No family history on file. Social History  Substance Use  Topics  . Smoking status: Former Smoker -- 1.00 packs/day for 30 years    Quit date: 10/10/1985  . Smokeless tobacco: None  . Alcohol Use: No   OB History    No data available     Review of Systems  Respiratory: Positive for shortness of breath. Negative for chest tightness.   Cardiovascular: Positive for leg swelling. Negative for chest pain.  Musculoskeletal:       Right leg pain  Skin: Negative for rash and wound.  Neurological: Negative for syncope.  All other systems reviewed and are negative.     Allergies  Review of patient's allergies indicates no known allergies.  Home Medications   Prior to Admission medications   Medication Sig Start Date End Date Taking? Authorizing Provider  carvedilol (COREG) 25 MG tablet Take 25 mg by mouth 2 (two) times daily with a meal.   Yes Historical Provider, MD  furosemide (LASIX) 40 MG tablet Take 60 mg by mouth daily.    Yes Historical Provider, MD  verapamil (COVERA HS) 240 MG (CO) 24 hr tablet Take 240 mg by mouth at bedtime.     Yes Historical Provider, MD  albuterol (PROVENTIL HFA;VENTOLIN HFA) 108 (90 BASE) MCG/ACT inhaler Inhale 2 puffs into the lungs every 6 (six) hours as needed for wheezing or shortness of breath. Patient not taking: Reported on 11/10/2015  10/11/14   Gregor Hams, MD  guaiFENesin-codeine 100-10 MG/5ML syrup Take 5 mLs by mouth at bedtime as needed for cough. Patient not taking: Reported on 11/10/2015 10/11/14   Gregor Hams, MD  ipratropium (ATROVENT) 0.06 % nasal spray Place 2 sprays into both nostrils 4 (four) times daily. Patient not taking: Reported on 11/10/2015 10/11/14   Gregor Hams, MD  predniSONE (DELTASONE) 10 MG tablet Take 3 tablets (30 mg total) by mouth daily. Patient not taking: Reported on 11/10/2015 10/11/14   Gregor Hams, MD   BP 152/51 mmHg  Pulse 65  Temp(Src) 97.9 F (36.6 C) (Oral)  Resp 18  SpO2 96% Physical Exam  Constitutional: She is oriented to person, place, and time.  Overweight, no  acute distress  HENT:  Head: Normocephalic and atraumatic.  Cardiovascular: Normal rate, regular rhythm and normal heart sounds.   No murmur heard. Pulmonary/Chest: Effort normal and breath sounds normal. No respiratory distress. She has no wheezes.  Musculoskeletal: She exhibits edema.  Right greater than left lower extremity edema, 1+, pain with range of motion of the right knee, contusion noted over the right knee, tenderness palpation of the right calf  Neurological: She is alert and oriented to person, place, and time.  Skin: Skin is warm and dry.  Psychiatric: She has a normal mood and affect.  Nursing note and vitals reviewed.   ED Course  Procedures (including critical care time) DIAGNOSTIC STUDIES: Oxygen Saturation is 96% on RA,  normal by my interpretation.    COORDINATION OF CARE: 11:35 PM Discussed treatment plan which includes lab work, CXR, EKG, right knee XR, and Percocet with pt at bedside and pt agreed to plan.  Labs Review Labs Reviewed  COMPREHENSIVE METABOLIC PANEL - Abnormal; Notable for the following:    Glucose, Bld 105 (*)    Creatinine, Ser 1.21 (*)    ALT 11 (*)    GFR calc non Af Amer 44 (*)    GFR calc Af Amer 51 (*)    All other components within normal limits  D-DIMER, QUANTITATIVE (NOT AT [facility]) - Abnormal; Notable for the following:    D-Dimer, Quant 0.77 (*)    All other components within normal limits  CBC WITH DIFFERENTIAL/PLATELET    Imaging Review Dg Chest 2 View  11/11/2015  CLINICAL DATA:  Fall tonight onto left side. EXAM: CHEST  2 VIEW COMPARISON:  02/12/2009 FINDINGS: Mild cardiomegaly, unchanged. The lungs are clear. Pulmonary vasculature is normal. No consolidation, pleural effusion, or pneumothorax. No acute osseous abnormalities are seen. There is degenerative change in the spine. IMPRESSION: No acute pulmonary process. Electronically Signed   By: Jeb Levering M.D.   On: 11/11/2015 01:15   Ct Angio Chest Pe W/cm &/or Wo  Cm  11/11/2015  CLINICAL DATA:  Right leg swelling.  Shortness of breath. EXAM: CT ANGIOGRAPHY CHEST WITH CONTRAST TECHNIQUE: Multidetector CT imaging of the chest was performed using the standard protocol during bolus administration of intravenous contrast. Multiplanar CT image reconstructions and MIPs were obtained to evaluate the vascular anatomy. CONTRAST:  159mL OMNIPAQUE IOHEXOL 350 MG/ML SOLN COMPARISON:  Radiograph earlier this day. FINDINGS: There are no filling defects within the pulmonary arteries to suggest pulmonary embolus. Multi chamber cardiomegaly. Normal caliber of thoracic aorta with mild atherosclerosis. No mediastinal or hilar adenopathy. No pleural or pericardial effusion. Subsegmental atelectasis in the lingula and anterior right middle and upper lobes. No consolidation to suggest pneumonia. Breathing motion artifact in the lower lobes.  No acute abnormality in the included upper abdomen. There are no acute or suspicious osseous abnormalities. Multilevel degenerative change in the thoracic spine. Review of the MIP images confirms the above findings. IMPRESSION: 1. No pulmonary embolus. 2. Cardiomegaly without acute intrathoracic process. Electronically Signed   By: Jeb Levering M.D.   On: 11/11/2015 03:10   Dg Knee Complete 4 Views Right  11/11/2015  CLINICAL DATA:  Fall tonight with right knee pain. EXAM: RIGHT KNEE - COMPLETE 4+ VIEW COMPARISON:  None. FINDINGS: No fracture or dislocation. The alignment and joint spaces are maintained. There is soft tissue edema. No joint effusion. IMPRESSION: No fracture or dislocation. Soft tissue edema. Electronically Signed   By: Jeb Levering M.D.   On: 11/11/2015 01:13   I personally reviewed and evaluated these images and lab results as a part of my medical decision-making.   EKG Interpretation   Date/Time:  Wednesday November 11 2015 00:14:47 EST Ventricular Rate:  55 PR Interval:  199 QRS Duration: 90 QT Interval:  482 QTC  Calculation: 461 R Axis:   17 Text Interpretation:  Sinus rhythm Atrial premature complex Confirmed by  HORTON  MD, [place] ([id number]) on 11/11/2015 12:20:24 AM      MDM   Final diagnoses:  Leg swelling    Patient presents with right greater than left leg swelling. Ongoing for the last week. Worsened after a fall today.  Nontoxic on exam. Does report intermittent shortness of breath. Chest x-ray and EKG are reassuring. D dimer is elevated 2.77. Given shortness of breath, will obtain CTA as DVT is in the differential for her leg swelling. CTA is negative. Will give patient Lovenox and have her return for right lower extremity Doppler.  After history, exam, and medical workup I feel the patient has been appropriately medically screened and is safe for discharge home. Pertinent diagnoses were discussed with the patient. Patient was given return precautions.  I personally performed the services described in this documentation, which was scribed in my presence. The recorded information has been reviewed and is accurate.    Annette Hacker, MD 11/11/15 [id number]

## 2015-11-10 NOTE — ED Notes (Signed)
Pt reports RLE swelling onset x3 days... See physicians note A&O x4... No acute distress.

## 2015-11-11 ENCOUNTER — Encounter (HOSPITAL_COMMUNITY): Payer: Self-pay | Admitting: Radiology

## 2015-11-11 ENCOUNTER — Ambulatory Visit (EMERGENCY_DEPARTMENT_HOSPITAL)
Admission: RE | Admit: 2015-11-11 | Discharge: 2015-11-11 | Disposition: A | Discharge status: Home / Self Care | Payer: Medicare Other | Source: Ambulatory Visit | Attending: Emergency Medicine | Admitting: Emergency Medicine

## 2015-11-11 ENCOUNTER — Emergency Department (HOSPITAL_COMMUNITY): Payer: Medicare Other

## 2015-11-11 DIAGNOSIS — M79609 Pain in unspecified limb: Secondary | ICD-10-CM | POA: Diagnosis not present

## 2015-11-11 DIAGNOSIS — S299XXA Unspecified injury of thorax, initial encounter: Secondary | ICD-10-CM | POA: Diagnosis not present

## 2015-11-11 DIAGNOSIS — M25561 Pain in right knee: Secondary | ICD-10-CM | POA: Diagnosis not present

## 2015-11-11 DIAGNOSIS — S8991XA Unspecified injury of right lower leg, initial encounter: Secondary | ICD-10-CM | POA: Diagnosis not present

## 2015-11-11 DIAGNOSIS — R0602 Shortness of breath: Secondary | ICD-10-CM | POA: Diagnosis not present

## 2015-11-11 LAB — D-DIMER, QUANTITATIVE: D-Dimer, Quant: 0.77 ug/mL-FEU — ABNORMAL HIGH (ref 0.00–0.50)

## 2015-11-11 MED ORDER — IOHEXOL 350 MG/ML SOLN
80.0000 mL | Freq: Once | INTRAVENOUS | Status: AC | PRN
  Administered 2015-11-11: 100 mL via INTRAVENOUS

## 2015-11-11 MED ORDER — ENOXAPARIN SODIUM 120 MG/0.8ML ~~LOC~~ SOLN
1.0000 mg/kg | Freq: Once | SUBCUTANEOUS | Status: AC
  Administered 2015-11-11: 110 mg via SUBCUTANEOUS
  Filled 2015-11-11: qty 0.8

## 2015-11-11 NOTE — Progress Notes (Signed)
*  Preliminary Results* Right lower extremity venous duplex completed. Right lower extremity is negative for deep vein thrombosis. There is evidence of right Baker's cyst.  11/11/2015 8:42 AM  Maudry Mayhew, RVT, RDCS, RDMS

## 2015-11-11 NOTE — ED Notes (Signed)
Patient presents with numerous complaints.  This evening she was visiting her Mother at the nursing home and she leaned over and fell out of the chair.  C/o right knee pain and bruised area the size of a quarter noted on the outer aspect of her knee.  States her legs have been swelling but her right leg is worse and more painful.  Feet swollen and c/o pain to her left foot is worse.

## 2015-11-11 NOTE — ED Notes (Signed)
Discharge instructions reviewed - voiced understanding 

## 2015-11-11 NOTE — Discharge Instructions (Signed)
You were seen today for leg swelling. You need to come back tomorrow for a ultrasound.  Edema Edema is an abnormal buildup of fluids in your bodytissues. Edema is somewhatdependent on gravity to pull the fluid to the lowest place in your body. That makes the condition more common in the legs and thighs (lower extremities). Painless swelling of the feet and ankles is common and becomes more likely as you get older. It is also common in looser tissues, like around your eyes.  When the affected area is squeezed, the fluid may move out of that spot and leave a dent for a few moments. This dent is called pitting.  CAUSES  There are many possible causes of edema. Eating too much salt and being on your feet or sitting for a long time can cause edema in your legs and ankles. Hot weather may make edema worse. Common medical causes of edema include:  Heart failure.  Liver disease.  Kidney disease.  Weak blood vessels in your legs.  Cancer.  An injury.  Pregnancy.  Some medications.  Obesity. SYMPTOMS  Edema is usually painless.Your skin may look swollen or shiny.  DIAGNOSIS  Your health care provider may be able to diagnose edema by asking about your medical history and doing a physical exam. You may need to have tests such as X-rays, an electrocardiogram, or blood tests to check for medical conditions that may cause edema.  TREATMENT  Edema treatment depends on the cause. If you have heart, liver, or kidney disease, you need the treatment appropriate for these conditions. General treatment may include:  Elevation of the affected body part above the level of your heart.  Compression of the affected body part. Pressure from elastic bandages or support stockings squeezes the tissues and forces fluid back into the blood vessels. This keeps fluid from entering the tissues.  Restriction of fluid and salt intake.  Use of a water pill (diuretic). These medications are appropriate only for  some types of edema. They pull fluid out of your body and make you urinate more often. This gets rid of fluid and reduces swelling, but diuretics can have side effects. Only use diuretics as directed by your health care provider. HOME CARE INSTRUCTIONS   Keep the affected body part above the level of your heart when you are lying down.   Do not sit still or stand for prolonged periods.   Do not put anything directly under your knees when lying down.  Do not wear constricting clothing or garters on your upper legs.   Exercise your legs to work the fluid back into your blood vessels. This may help the swelling go down.   Wear elastic bandages or support stockings to reduce ankle swelling as directed by your health care provider.   Eat a low-salt diet to reduce fluid if your health care provider recommends it.   Only take medicines as directed by your health care provider. SEEK MEDICAL CARE IF:   Your edema is not responding to treatment.  You have heart, liver, or kidney disease and notice symptoms of edema.  You have edema in your legs that does not improve after elevating them.   You have sudden and unexplained weight gain. SEEK IMMEDIATE MEDICAL CARE IF:   You develop shortness of breath or chest pain.   You cannot breathe when you lie down.  You develop pain, redness, or warmth in the swollen areas.   You have heart, liver, or kidney disease and  suddenly get edema.  You have a fever and your symptoms suddenly get worse. MAKE SURE YOU:   Understand these instructions.  Will watch your condition.  Will get help right away if you are not doing well or get worse.   This information is not intended to replace advice given to you by your health care provider. Make sure you discuss any questions you have with your health care provider.   Document Released: 09/26/2005 Document Revised: 10/17/2014 Document Reviewed: 07/19/2013 Elsevier Interactive Patient Education  [employer].

## 2015-11-24 DIAGNOSIS — M7121 Synovial cyst of popliteal space [Baker], right knee: Secondary | ICD-10-CM | POA: Diagnosis not present

## 2015-11-24 DIAGNOSIS — R609 Edema, unspecified: Secondary | ICD-10-CM | POA: Diagnosis not present

## 2015-11-25 ENCOUNTER — Other Ambulatory Visit: Payer: Self-pay | Admitting: Internal Medicine

## 2015-11-25 DIAGNOSIS — M7121 Synovial cyst of popliteal space [Baker], right knee: Secondary | ICD-10-CM

## 2015-12-09 ENCOUNTER — Inpatient Hospital Stay
Admission: RE | Admit: 2015-12-09 | Discharge: 2015-12-09 | Disposition: A | Discharge status: Home / Self Care | Payer: Medicare Other | Source: Ambulatory Visit | Attending: Internal Medicine | Admitting: Internal Medicine

## 2015-12-21 ENCOUNTER — Ambulatory Visit
Admission: RE | Admit: 2015-12-21 | Discharge: 2015-12-21 | Disposition: A | Discharge status: Home / Self Care | Payer: Medicare Other | Source: Ambulatory Visit | Attending: Internal Medicine | Admitting: Internal Medicine

## 2015-12-21 DIAGNOSIS — S83281A Other tear of lateral meniscus, current injury, right knee, initial encounter: Secondary | ICD-10-CM | POA: Diagnosis not present

## 2015-12-21 DIAGNOSIS — M7121 Synovial cyst of popliteal space [Baker], right knee: Secondary | ICD-10-CM

## 2015-12-31 DIAGNOSIS — S83411A Sprain of medial collateral ligament of right knee, initial encounter: Secondary | ICD-10-CM | POA: Diagnosis not present

## 2015-12-31 DIAGNOSIS — S83281A Other tear of lateral meniscus, current injury, right knee, initial encounter: Secondary | ICD-10-CM | POA: Diagnosis not present

## 2016-01-01 DIAGNOSIS — S83281D Other tear of lateral meniscus, current injury, right knee, subsequent encounter: Secondary | ICD-10-CM | POA: Diagnosis not present

## 2016-01-01 DIAGNOSIS — S83411D Sprain of medial collateral ligament of right knee, subsequent encounter: Secondary | ICD-10-CM | POA: Diagnosis not present

## 2016-01-29 DIAGNOSIS — M1711 Unilateral primary osteoarthritis, right knee: Secondary | ICD-10-CM | POA: Diagnosis not present

## 2016-01-29 DIAGNOSIS — S83411D Sprain of medial collateral ligament of right knee, subsequent encounter: Secondary | ICD-10-CM | POA: Diagnosis not present

## 2016-01-29 DIAGNOSIS — S83281D Other tear of lateral meniscus, current injury, right knee, subsequent encounter: Secondary | ICD-10-CM | POA: Diagnosis not present

## 2016-02-04 DIAGNOSIS — K5730 Diverticulosis of large intestine without perforation or abscess without bleeding: Secondary | ICD-10-CM | POA: Diagnosis not present

## 2016-02-19 DIAGNOSIS — S83281D Other tear of lateral meniscus, current injury, right knee, subsequent encounter: Secondary | ICD-10-CM | POA: Diagnosis not present

## 2016-02-19 DIAGNOSIS — S83411D Sprain of medial collateral ligament of right knee, subsequent encounter: Secondary | ICD-10-CM | POA: Diagnosis not present

## 2016-03-01 DIAGNOSIS — S83281D Other tear of lateral meniscus, current injury, right knee, subsequent encounter: Secondary | ICD-10-CM | POA: Diagnosis not present

## 2016-03-04 DIAGNOSIS — S83281D Other tear of lateral meniscus, current injury, right knee, subsequent encounter: Secondary | ICD-10-CM | POA: Diagnosis not present

## 2016-03-08 DIAGNOSIS — L304 Erythema intertrigo: Secondary | ICD-10-CM | POA: Diagnosis not present

## 2016-03-08 DIAGNOSIS — C8400 Mycosis fungoides, unspecified site: Secondary | ICD-10-CM | POA: Diagnosis not present

## 2016-03-08 DIAGNOSIS — L814 Other melanin hyperpigmentation: Secondary | ICD-10-CM | POA: Diagnosis not present

## 2016-03-09 DIAGNOSIS — S83281D Other tear of lateral meniscus, current injury, right knee, subsequent encounter: Secondary | ICD-10-CM | POA: Diagnosis not present

## 2016-03-11 DIAGNOSIS — S83281D Other tear of lateral meniscus, current injury, right knee, subsequent encounter: Secondary | ICD-10-CM | POA: Diagnosis not present

## 2016-03-15 DIAGNOSIS — S83281D Other tear of lateral meniscus, current injury, right knee, subsequent encounter: Secondary | ICD-10-CM | POA: Diagnosis not present

## 2016-03-18 DIAGNOSIS — S83281D Other tear of lateral meniscus, current injury, right knee, subsequent encounter: Secondary | ICD-10-CM | POA: Diagnosis not present

## 2016-03-22 DIAGNOSIS — S83281D Other tear of lateral meniscus, current injury, right knee, subsequent encounter: Secondary | ICD-10-CM | POA: Diagnosis not present

## 2016-03-29 DIAGNOSIS — S83281D Other tear of lateral meniscus, current injury, right knee, subsequent encounter: Secondary | ICD-10-CM | POA: Diagnosis not present

## 2016-03-31 DIAGNOSIS — S83281D Other tear of lateral meniscus, current injury, right knee, subsequent encounter: Secondary | ICD-10-CM | POA: Diagnosis not present

## 2016-05-11 DIAGNOSIS — Z803 Family history of malignant neoplasm of breast: Secondary | ICD-10-CM | POA: Diagnosis not present

## 2016-05-11 DIAGNOSIS — Z1231 Encounter for screening mammogram for malignant neoplasm of breast: Secondary | ICD-10-CM | POA: Diagnosis not present

## 2016-06-24 DIAGNOSIS — D509 Iron deficiency anemia, unspecified: Secondary | ICD-10-CM | POA: Diagnosis not present

## 2016-06-24 DIAGNOSIS — Z23 Encounter for immunization: Secondary | ICD-10-CM | POA: Diagnosis not present

## 2016-06-24 DIAGNOSIS — E012 Iodine-deficiency related (endemic) goiter, unspecified: Secondary | ICD-10-CM | POA: Diagnosis not present

## 2016-06-24 DIAGNOSIS — E785 Hyperlipidemia, unspecified: Secondary | ICD-10-CM | POA: Diagnosis not present

## 2016-06-24 DIAGNOSIS — Z0000 Encounter for general adult medical examination without abnormal findings: Secondary | ICD-10-CM | POA: Diagnosis not present

## 2016-06-24 DIAGNOSIS — E039 Hypothyroidism, unspecified: Secondary | ICD-10-CM | POA: Diagnosis not present

## 2016-06-24 DIAGNOSIS — I119 Hypertensive heart disease without heart failure: Secondary | ICD-10-CM | POA: Diagnosis not present

## 2016-07-20 DIAGNOSIS — G8929 Other chronic pain: Secondary | ICD-10-CM | POA: Diagnosis not present

## 2016-07-20 DIAGNOSIS — I119 Hypertensive heart disease without heart failure: Secondary | ICD-10-CM | POA: Diagnosis not present

## 2016-07-20 DIAGNOSIS — K5900 Constipation, unspecified: Secondary | ICD-10-CM | POA: Diagnosis not present

## 2016-08-10 DIAGNOSIS — L245 Irritant contact dermatitis due to other chemical products: Secondary | ICD-10-CM | POA: Diagnosis not present

## 2016-08-10 DIAGNOSIS — L282 Other prurigo: Secondary | ICD-10-CM | POA: Diagnosis not present

## 2016-09-12 DIAGNOSIS — L309 Dermatitis, unspecified: Secondary | ICD-10-CM | POA: Diagnosis not present

## 2016-09-12 DIAGNOSIS — C8400 Mycosis fungoides, unspecified site: Secondary | ICD-10-CM | POA: Diagnosis not present

## 2016-10-31 DIAGNOSIS — H40003 Preglaucoma, unspecified, bilateral: Secondary | ICD-10-CM | POA: Diagnosis not present

## 2017-01-17 DIAGNOSIS — L282 Other prurigo: Secondary | ICD-10-CM | POA: Diagnosis not present

## 2017-01-17 DIAGNOSIS — C8400 Mycosis fungoides, unspecified site: Secondary | ICD-10-CM | POA: Diagnosis not present

## 2017-02-09 DIAGNOSIS — K5900 Constipation, unspecified: Secondary | ICD-10-CM | POA: Diagnosis not present

## 2017-02-09 DIAGNOSIS — I10 Essential (primary) hypertension: Secondary | ICD-10-CM | POA: Diagnosis not present

## 2017-03-13 ENCOUNTER — Ambulatory Visit: Payer: BC Managed Care – PPO | Admitting: Podiatry

## 2017-03-17 ENCOUNTER — Ambulatory Visit (INDEPENDENT_AMBULATORY_CARE_PROVIDER_SITE_OTHER): Payer: BC Managed Care – PPO | Admitting: Podiatry

## 2017-03-17 ENCOUNTER — Encounter: Payer: Self-pay | Admitting: Podiatry

## 2017-03-17 ENCOUNTER — Ambulatory Visit (INDEPENDENT_AMBULATORY_CARE_PROVIDER_SITE_OTHER): Payer: BC Managed Care – PPO

## 2017-03-17 VITALS — BP 142/51 | HR 79

## 2017-03-17 DIAGNOSIS — M722 Plantar fascial fibromatosis: Secondary | ICD-10-CM

## 2017-03-17 DIAGNOSIS — R52 Pain, unspecified: Secondary | ICD-10-CM

## 2017-03-17 MED ORDER — MELOXICAM 15 MG PO TABS: 15.0000 mg | ORAL_TABLET | Freq: Every day | ORAL | 0 refills | Status: DC

## 2017-03-17 NOTE — Progress Notes (Signed)
   Subjective:    Patient ID: [name], female    DOB: 07-11-1941, 77 y.o.   MRN: [id number]  HPI this patient presents the office with chief complaint of a painful heel on both feet. She says that her left heel is more painful than her right heel. She has pain upon rising in the morning and standing from a sitting position. She  says there are times when she experiences pain radiating up to her knee from her foot. She says that her pain is approximately 8 or 9 out of 10. She says she has attempted to wear to all inserts but the problem appears to be worsening. She presents the office today for an evaluation and treatment of her painful feet    Review of Systems  All other systems reviewed and are negative.      Objective:   Physical Exam GENERAL APPEARANCE: Alert, conversant. Appropriately groomed. No acute distress.  VASCULAR: Pedal pulses are  palpable at  [facility] and PT bilateral.  Capillary refill time is immediate to all digits,  Normal temperature gradient.  Digital hair growth is present bilateral  NEUROLOGIC: sensation is normal to 5.07 monofilament at 5/5 sites bilateral.  Light touch is intact bilateral, Muscle strength normal.  MUSCULOSKELETAL: acceptable muscle strength, tone and stability bilateral.  Intrinsic muscluature intact bilateral.  Rectus appearance of foot and digits noted bilateral. Palpable pain noted at the insertion of the plantar fascia of the left greater than the right  DERMATOLOGIC: skin color, texture, and turgor are within normal limits.  No preulcerative lesions or ulcers  are seen, no interdigital maceration noted.  No open lesions present.  Digital nails are asymptomatic. No drainage noted.         Assessment & Plan:  Plantar fascitis B/L.    IE  X-rays reveal mild calcification at the insertion of the plantar fascia both feet. Injection therapy was provided in the left foot. Prescribed Mobic by mouth. Patient was given stretching and instructions  to ice her foot.   power steps were prescribed .  Patient is to return to the office in 2 weeks for continued evaluation and treatment of her painful feet   Gardiner Barefoot DPM

## 2017-03-24 ENCOUNTER — Ambulatory Visit: Payer: BC Managed Care – PPO | Admitting: Podiatry

## 2017-03-31 ENCOUNTER — Encounter: Payer: Self-pay | Admitting: Podiatry

## 2017-03-31 ENCOUNTER — Ambulatory Visit (INDEPENDENT_AMBULATORY_CARE_PROVIDER_SITE_OTHER): Payer: BC Managed Care – PPO | Admitting: Podiatry

## 2017-03-31 DIAGNOSIS — M722 Plantar fascial fibromatosis: Secondary | ICD-10-CM | POA: Diagnosis not present

## 2017-03-31 NOTE — Progress Notes (Signed)
   Subjective:    Patient ID: [name], female    DOB: 1941/03/10, 76 y.o.   MRN: [id number]  HPI this patient presents the office For an evaluation of her painful heels both feet. She was diagnosed with plantar fasciitis bilaterally. Her pain was approximately 9 out of 10. She was examined in my office 2 weeks ago and treated with injection therapy Mobic by mouth and power step insoles. She presents the office today stating that her pain is much less. She says she is no longer experiencing sharp, shooting and radiating pains through the bottom of her foot. She also says she is not having pain upon rising in the morning. She does admit to having continued pain noted in the back of the right foot and a little in the left heel, but patient is very pleased with her progress and presents the office today for continued evaluation and treatment    Review of Systems  All other systems reviewed and are negative.      Objective:   Physical Exam GENERAL APPEARANCE: Alert, conversant. Appropriately groomed. No acute distress.  VASCULAR: Pedal pulses are  palpable at  [facility] and PT bilateral.  Capillary refill time is immediate to all digits,  Normal temperature gradient.  Digital hair growth is present bilateral  NEUROLOGIC: sensation is normal to 5.07 monofilament at 5/5 sites bilateral.  Light touch is intact bilateral, Muscle strength normal.  MUSCULOSKELETAL: acceptable muscle strength, tone and stability bilateral.  Intrinsic muscluature intact bilateral.  Rectus appearance of foot and digits noted bilateral. No heel elicited upon palpation of heels.  DERMATOLOGIC: skin color, texture, and turgor are within normal limits.  No preulcerative lesions or ulcers  are seen, no interdigital maceration noted.  No open lesions present.  Digital nails are asymptomatic. No drainage noted.         Assessment & Plan:  Plantar fascitis B/L.    Return office visit.  Patient was told to continue to take  the Mobic and he uses the power step insoles in her shoes.  She was instructed that once the pain is completely resolved to take the Mobic for 3 additional days, then stop.  She was told to return to the office as needed for continued evaluation and treatment   Gardiner Barefoot DPM   Gardiner Barefoot DPM

## 2017-04-14 ENCOUNTER — Other Ambulatory Visit: Payer: Self-pay | Admitting: Podiatry

## 2017-04-14 NOTE — Telephone Encounter (Signed)
Further refills will require an appointment

## 2017-05-18 DIAGNOSIS — Z803 Family history of malignant neoplasm of breast: Secondary | ICD-10-CM | POA: Diagnosis not present

## 2017-05-18 DIAGNOSIS — Z1231 Encounter for screening mammogram for malignant neoplasm of breast: Secondary | ICD-10-CM | POA: Diagnosis not present

## 2017-06-30 ENCOUNTER — Encounter (HOSPITAL_COMMUNITY): Payer: Self-pay | Admitting: Emergency Medicine

## 2017-06-30 ENCOUNTER — Ambulatory Visit (HOSPITAL_COMMUNITY)
Admission: EM | Admit: 2017-06-30 | Discharge: 2017-06-30 | Disposition: A | Discharge status: Home / Self Care | Payer: Medicare Other | Attending: Urgent Care | Admitting: Urgent Care

## 2017-06-30 DIAGNOSIS — R059 Cough, unspecified: Secondary | ICD-10-CM

## 2017-06-30 DIAGNOSIS — B9789 Other viral agents as the cause of diseases classified elsewhere: Secondary | ICD-10-CM | POA: Insufficient documentation

## 2017-06-30 DIAGNOSIS — J029 Acute pharyngitis, unspecified: Secondary | ICD-10-CM | POA: Diagnosis not present

## 2017-06-30 DIAGNOSIS — E785 Hyperlipidemia, unspecified: Secondary | ICD-10-CM | POA: Insufficient documentation

## 2017-06-30 DIAGNOSIS — R05 Cough: Secondary | ICD-10-CM | POA: Diagnosis not present

## 2017-06-30 DIAGNOSIS — K589 Irritable bowel syndrome without diarrhea: Secondary | ICD-10-CM | POA: Diagnosis not present

## 2017-06-30 DIAGNOSIS — R001 Bradycardia, unspecified: Secondary | ICD-10-CM | POA: Diagnosis not present

## 2017-06-30 DIAGNOSIS — H938X3 Other specified disorders of ear, bilateral: Secondary | ICD-10-CM

## 2017-06-30 DIAGNOSIS — B349 Viral infection, unspecified: Secondary | ICD-10-CM

## 2017-06-30 LAB — POCT RAPID STREP A: Streptococcus, Group A Screen (Direct): NEGATIVE

## 2017-06-30 MED ORDER — BENZONATATE 100 MG PO CAPS: 100.0000 mg | ORAL_CAPSULE | Freq: Three times a day (TID) | ORAL | 0 refills | Status: DC | PRN

## 2017-06-30 MED ORDER — HYDROCODONE-HOMATROPINE 5-1.5 MG/5ML PO SYRP: 5.0000 mL | ORAL_SOLUTION | Freq: Every evening | ORAL | 0 refills | Status: DC | PRN

## 2017-06-30 NOTE — ED Triage Notes (Signed)
Pt here for ST onset 5 days associated w/prod cough, sneezing, bilateral ear fullness and right eye drainage  Gargling warm salt water w/temp relief.   Denies fevers, chills  A&O x4... NAD.[facility]

## 2017-06-30 NOTE — ED Provider Notes (Signed)
MRN: [id number] DOB: 1940/10/27  Subjective:   [name] is a 76 y.o. female presenting for chief complaint of Sore Throat  Reports 5 day history of worsening sore throat, mostly dry cough, sneezing, bilateral ear fullness. Patient is having difficulty swallowing. Patient has some wheezing as well. Has tried salt water gargles. Denies fever, sinus pain, sinus congestion, ear pain, ear drainage, chest pain, n/v, abdominal pain, rashes. Denies history of asthma, allergies. Denies smoking cigarettes. Patient has a history of HTN managed with furosemide, carvedilol, verapamil.   [name] has No Known Allergies.  [name]  has a past medical history of Bradycardia; Chest pain; Dyslipidemia; Ejection fraction; Heart murmur; Hemorrhoids; Herpes simplex without mention of complication; History of skin cancer; colonic polyps; IBS (irritable bowel syndrome); Overweight(278.02); and Tachycardia. Also  has a past surgical history that includes Cataract extraction; Total abdominal hysterectomy w/ bilateral salpingoophorectomy; Appendectomy; and Tonsillectomy.  Objective:   Vitals: BP (!) 122/53 (BP Location: Left Arm)   Pulse (!) 59   Temp 98.2 F (36.8 C) (Oral)   Resp 20   SpO2 99%   Physical Exam  Constitutional: She is oriented to person, place, and time. She appears well-developed and well-nourished.  HENT:  TM's intact bilaterally, no effusions or erythema. Nasal turbinates pink and moist, nasal passages patent. No sinus tenderness. Oropharynx clear, mucous membranes moist.  Eyes: Right eye exhibits no discharge. Left eye exhibits no discharge.  Neck: Normal range of motion. Neck supple.  Cardiovascular: Normal rate, regular rhythm and intact distal pulses.  Exam reveals no gallop and no friction rub.   No murmur heard. Pulmonary/Chest: No respiratory distress. She has no wheezes. She has no rales.  Lymphadenopathy:    She has cervical adenopathy (bilateral).  Neurological: She is  alert and oriented to person, place, and time.  Skin: Skin is warm and dry.  Psychiatric: She has a normal mood and affect.   Results for orders placed or performed during the hospital encounter of 06/30/17 (from the past 24 hour(s))  POCT rapid strep A [facility])     Status: None   Collection Time: 06/30/17  7:27 PM  Result Value Ref Range   Streptococcus, Group A Screen (Direct) NEGATIVE NEGATIVE   Assessment and Plan :   Viral pharyngitis  Sore throat  Cough  Ear fullness, bilateral   Will manage as a viral syndrome with supportive care, strep culture pending. Counseled patient on potential for adverse effects with medications prescribed today, patient verbalized understanding. Return-to-clinic precautions discussed, patient verbalized understanding.   Jaynee Eagles, PA-C [facility]  06/30/2017  6:56 PM    Jaynee Eagles, PA-C 06/30/17 [id number]

## 2017-06-30 NOTE — Discharge Instructions (Addendum)
For sore throat try using a honey-based tea. Use 3 teaspoons of honey with juice squeezed from half lemon. Place shaved pieces of ginger into 1/2-1 cup of water and warm over stove top. Then mix the ingredients and repeat every 4 hours as needed. 

## 2017-07-03 LAB — CULTURE, GROUP A STREP (THRC)

## 2017-07-04 DIAGNOSIS — Z0000 Encounter for general adult medical examination without abnormal findings: Secondary | ICD-10-CM | POA: Diagnosis not present

## 2017-07-04 DIAGNOSIS — I10 Essential (primary) hypertension: Secondary | ICD-10-CM | POA: Diagnosis not present

## 2017-07-04 DIAGNOSIS — Z1211 Encounter for screening for malignant neoplasm of colon: Secondary | ICD-10-CM | POA: Diagnosis not present

## 2017-07-04 DIAGNOSIS — H109 Unspecified conjunctivitis: Secondary | ICD-10-CM | POA: Diagnosis not present

## 2017-07-12 DIAGNOSIS — C8400 Mycosis fungoides, unspecified site: Secondary | ICD-10-CM | POA: Diagnosis not present

## 2017-07-12 DIAGNOSIS — C84A0 Cutaneous T-cell lymphoma, unspecified, unspecified site: Secondary | ICD-10-CM | POA: Diagnosis not present

## 2017-07-12 DIAGNOSIS — L304 Erythema intertrigo: Secondary | ICD-10-CM | POA: Diagnosis not present

## 2017-07-26 DIAGNOSIS — Z6841 Body Mass Index (BMI) 40.0 and over, adult: Secondary | ICD-10-CM | POA: Diagnosis not present

## 2017-07-26 DIAGNOSIS — E049 Nontoxic goiter, unspecified: Secondary | ICD-10-CM | POA: Diagnosis not present

## 2017-07-26 DIAGNOSIS — I10 Essential (primary) hypertension: Secondary | ICD-10-CM | POA: Diagnosis not present

## 2017-07-26 DIAGNOSIS — Z23 Encounter for immunization: Secondary | ICD-10-CM | POA: Diagnosis not present

## 2017-09-14 DIAGNOSIS — I10 Essential (primary) hypertension: Secondary | ICD-10-CM | POA: Diagnosis not present

## 2017-11-08 ENCOUNTER — Ambulatory Visit (INDEPENDENT_AMBULATORY_CARE_PROVIDER_SITE_OTHER): Payer: BC Managed Care – PPO

## 2017-11-08 ENCOUNTER — Encounter: Payer: Self-pay | Admitting: Podiatry

## 2017-11-08 ENCOUNTER — Ambulatory Visit (INDEPENDENT_AMBULATORY_CARE_PROVIDER_SITE_OTHER): Payer: BC Managed Care – PPO | Admitting: Podiatry

## 2017-11-08 DIAGNOSIS — M129 Arthropathy, unspecified: Secondary | ICD-10-CM | POA: Diagnosis not present

## 2017-11-08 DIAGNOSIS — M7661 Achilles tendinitis, right leg: Secondary | ICD-10-CM

## 2017-11-08 DIAGNOSIS — R52 Pain, unspecified: Secondary | ICD-10-CM | POA: Diagnosis not present

## 2017-11-08 MED ORDER — MELOXICAM 15 MG PO TABS: 15.0000 mg | ORAL_TABLET | Freq: Every day | ORAL | 0 refills | Status: DC

## 2017-11-08 NOTE — Progress Notes (Signed)
This patient presents the office stating that she is now experiencing pain in both feet and swelling in both feet.  She says the swelling in her feet occurs when she stands and when she walks.  The swelling then diminishes as she sits and raises her feet.  She also points to pain noted in the back of her right foot.  She says she is experiences severe pain at this level, which is aggravated by activity.  She also points to pain noted in her left forefoot and midfoot.  She says there is no specific area, just a generalized discomfort in the foot.  She says the pain in her right foot is approximately 8. while the pain in her left foot is approximately 3.  She was seen by myself in June 2018 and  diagnosed with plantar fasciitis.  She was treated with injection therapy Mobic  and power step insoles. She improved initially but over time the pain has returned to both feet.  She presents the office today for further evaluation and treatment of her feet   General Appearance  Alert, conversant and in no acute stress.  Vascular  Dorsalis pedis and posterior pulses are palpable  bilaterally.  Capillary return is within normal limits  bilaterally. Temperature is within normal limits  Bilaterally.  Neurologic  Senn-Weinstein monofilament wire test within normal limits  bilaterally. Muscle power within normal limits bilaterally.  Nails Normal nails noted with no evidence of bacterial or fungal infection.  Orthopedic  No limitations of motion of motion feet bilaterally.  No crepitus or effusions noted.  HAV deformity 1st MPJ  B/L.  Pes planus foot type  B/L.  Pain noted at the insertion achilles tendon right foot.  Midfoot arthritis noted left foot.  Palpable pain over metatarsals left foot. Mild swelling noted, feet bilaterally.  Skin  normotropic skin with no porokeratosis noted bilaterally.  No signs of infections or ulcers noted.     Diagnosis  Insertional tendinitis right achilles tendon.  Midfoot  arthritis  ROV  X-rays taken reveal calcification noted at the insertion of the Achilles tendon, right foot.  No bony pathology noted through the forefoot left.  The lateral x-ray reveals arthritic changes dorsally at the midfoot.  Discussed my clinical findings and x-ray findings with this patient.  Told her to start by wearing the power step insoles to help to support her left foot and relieve her arthritic pain.  She was also given a prescription for Mobic to be taken daily for pain.  She is to return to the office in 2 weeks for further evaluation.  She could be a candidate for an Unna boot/surgical shoe.     Gardiner Barefoot DPM

## 2017-11-09 DIAGNOSIS — H40003 Preglaucoma, unspecified, bilateral: Secondary | ICD-10-CM | POA: Diagnosis not present

## 2017-11-14 DIAGNOSIS — M170 Bilateral primary osteoarthritis of knee: Secondary | ICD-10-CM | POA: Diagnosis not present

## 2017-11-14 DIAGNOSIS — M25562 Pain in left knee: Secondary | ICD-10-CM | POA: Diagnosis not present

## 2017-11-14 DIAGNOSIS — M25561 Pain in right knee: Secondary | ICD-10-CM | POA: Diagnosis not present

## 2017-11-23 ENCOUNTER — Other Ambulatory Visit: Payer: Self-pay | Admitting: Podiatry

## 2017-11-23 DIAGNOSIS — M7661 Achilles tendinitis, right leg: Secondary | ICD-10-CM

## 2017-11-24 ENCOUNTER — Ambulatory Visit (INDEPENDENT_AMBULATORY_CARE_PROVIDER_SITE_OTHER): Payer: BC Managed Care – PPO | Admitting: Podiatry

## 2017-11-24 ENCOUNTER — Encounter: Payer: Self-pay | Admitting: Podiatry

## 2017-11-24 DIAGNOSIS — M129 Arthropathy, unspecified: Secondary | ICD-10-CM | POA: Diagnosis not present

## 2017-11-24 DIAGNOSIS — M7672 Peroneal tendinitis, left leg: Secondary | ICD-10-CM

## 2017-11-24 MED ORDER — MELOXICAM 15 MG PO TABS: 15.0000 mg | ORAL_TABLET | Freq: Every day | ORAL | 0 refills | Status: AC

## 2017-11-24 NOTE — Progress Notes (Signed)
This patient presents the office for reevaluation of her painful feet.  She was diagnosed with an insertional tendinitis of the right Achilles.  She was also diagnosed as having midfoot arthritis on the left foot, leading to metatarsal pain.  She was told to wear her power step insoles and prescribed multiple diabetic for the pain.  She returns to the office today stating she's having no pain or discomfort in her right foot.  She says the pain in her left foot continues to be the same as last visit.  She says she talked to her orthopedic doctor who said the ankle pain is probably due to the severe ankle arthritis and swelling in her left leg.  She is scheduled to have injections in her knee performed in the near future.  The orthopedic doctor recommended that she continue to take the Mobic since that will help with her knee pain.  . She also describes pain on the outside of her left foot.  She presents the office today for continued evaluation and treatment of her painful left foot.          General Appearance  Alert, conversant and in no acute stress.  Vascular  Dorsalis pedis and posterior pulses are palpable  bilaterally.  Capillary return is within normal limits  bilaterally. Temperature is within normal limits  Bilaterally.  Neurologic  Senn-Weinstein monofilament wire test within normal limits  bilaterally. Muscle power within normal limits bilaterally.  Nails Thick disfigured discolored nails with subungual debris bilaterally from hallux to fifth toes bilaterally. No evidence of bacterial infection or drainage bilaterally.  Orthopedic  No limitations of motion of motion feet bilaterally.  No crepitus or effusions noted.  No bony pathology or digital deformities noted.Right foot is painfree.  Left foot is painful around left ankle and along the course and at the insertion of peroneal tendon left foot.  Skin  normotropic skin with no porokeratosis noted bilaterally.  No signs of infections  or ulcers noted.     Ankle arthritis  Peroneal tendinitis left foot.  ROV.  The right foot has responded well to the medication.  The left foot continues to be painful and she and her orthopedic doctor believes it is due to her gait due to the severe arthritis in the left knee.  . Therefore, I continued to prescribe Mobic to be taken orally.  She also asked if her nails can be done in the future and I told her to make an appointment as she leaves today to schedule nail care     Gardiner Barefoot DPM

## 2017-12-03 IMAGING — MR MR KNEE*R* W/O CM
4 of 6 series · 19 of 40 positions shown · non-contrast
Comparison: MRI right knee 11/11/2015.

CLINICAL DATA: Status post fall with a twisting injury of the right
knee 11/10/2015. Medial right knee pain. Subsequent encounter.

EXAM:
MRI OF THE RIGHT KNEE WITHOUT CONTRAST
TECHNIQUE: Multiplanar, multisequence MR imaging of the knee was performed. No
intravenous contrast was administered.

[Series 3: pd_tse_fs_tra · axial · 3.5mm · 0.39mm/px · z∈[-1,+61]mm · 3 of 26 slices shown]
[im 5/26]
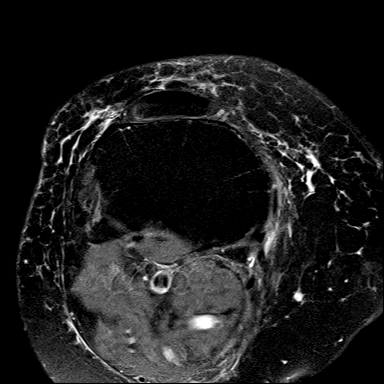
[im 13/26]
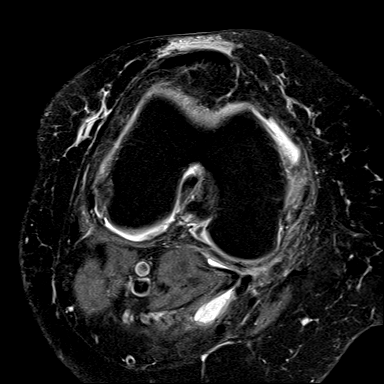
[im 21/26]
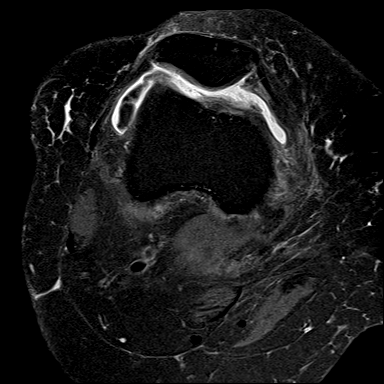

[Series 5: T2 fat-sat · coronal · 3.0mm · 0.29mm/px · 7 of 31 slices shown]
[im 1/31]
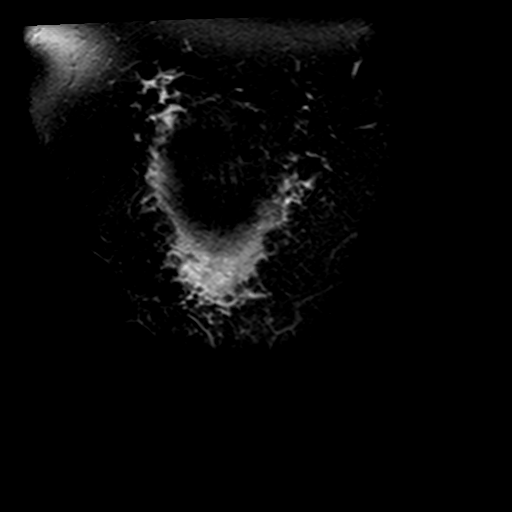
[im 5/31]
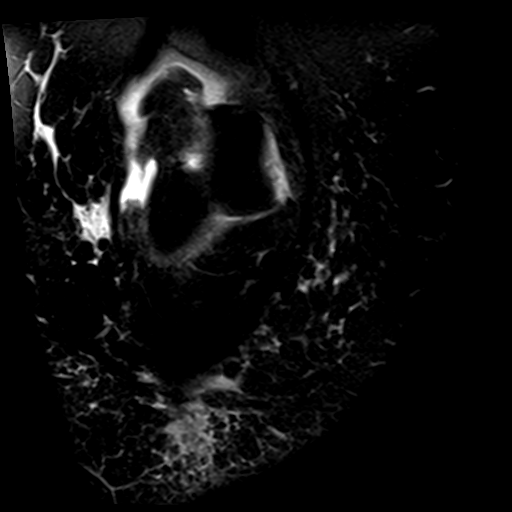
[im 9/31]
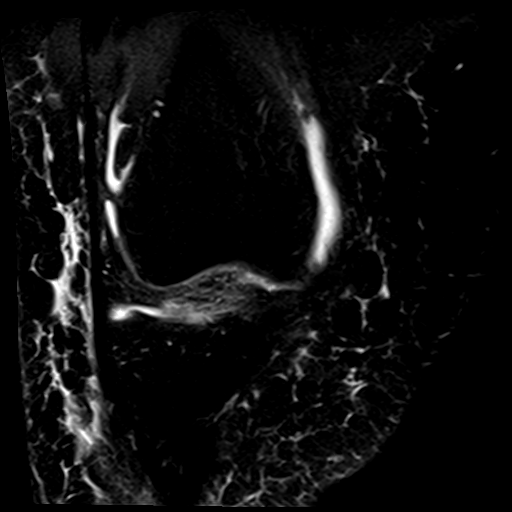
[im 13/31]
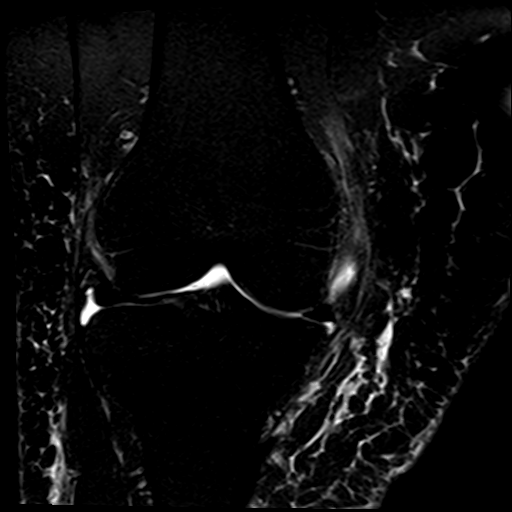
[im 18/31]
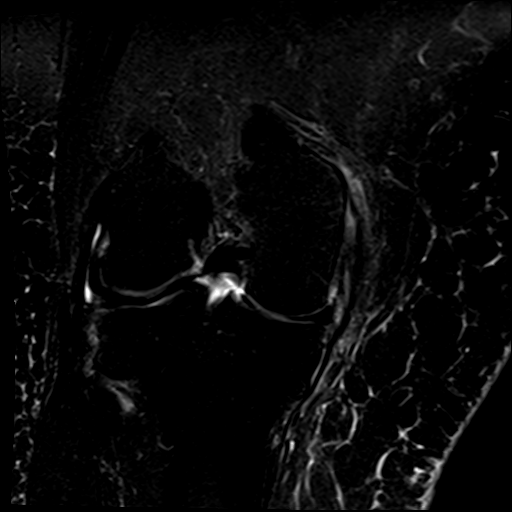
[im 22/31]
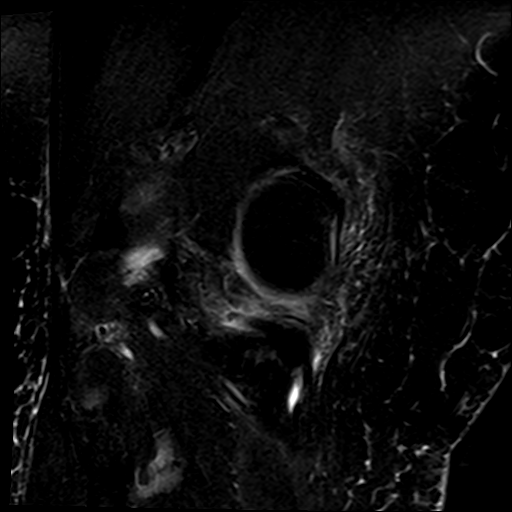
[im 26/31]
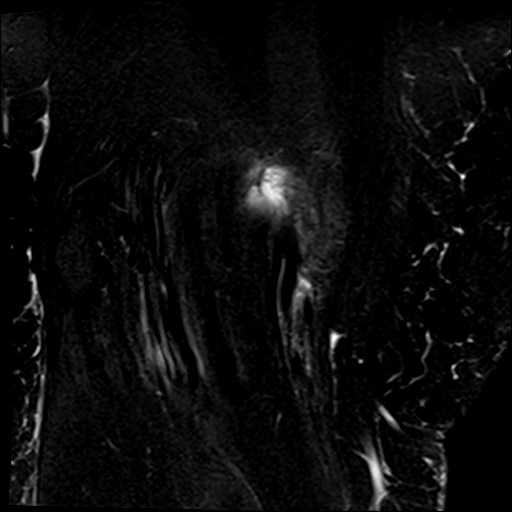

[Series 7: T1 · coronal · 3.0mm · 0.23mm/px · 3 of 32 slices shown]
[im 5/32]
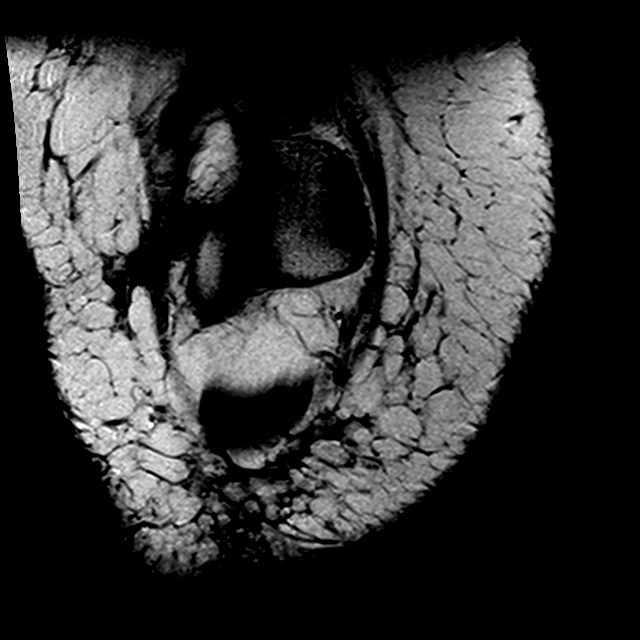
[im 18/32]
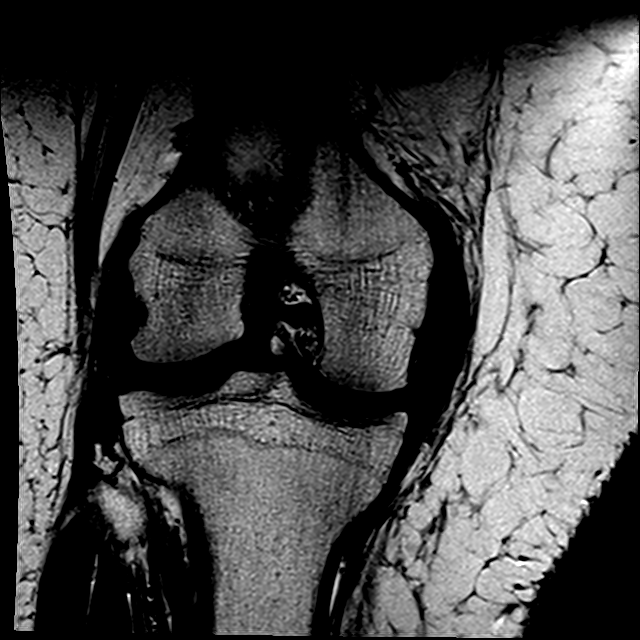
[im 27/32]
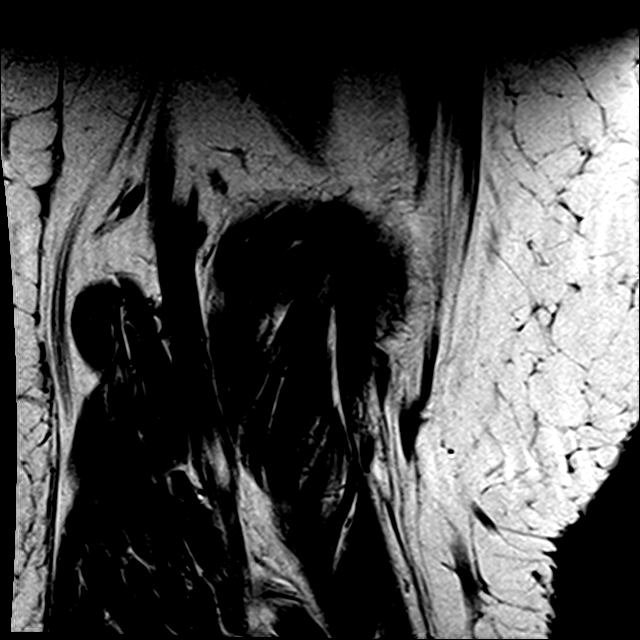

[Series 9: PD fat-sat · sagittal · 4.0mm · 0.31mm/px · 6 of 23 slices shown]
[im 1/23]
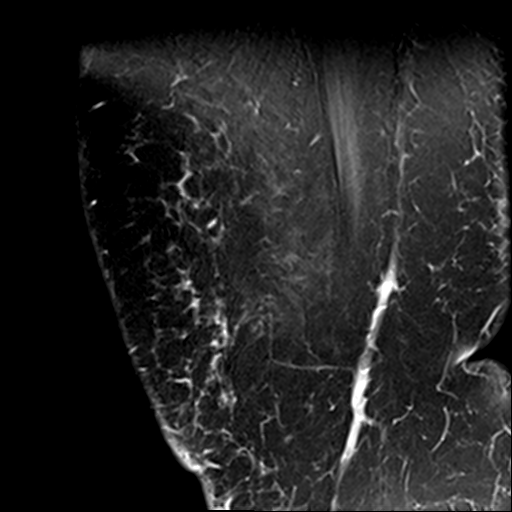
[im 5/23]
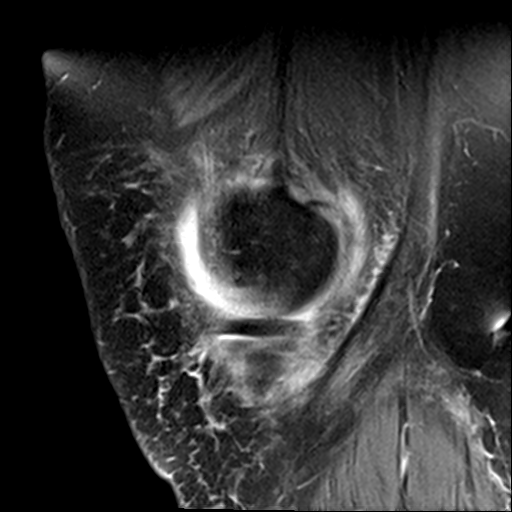
[im 9/23]
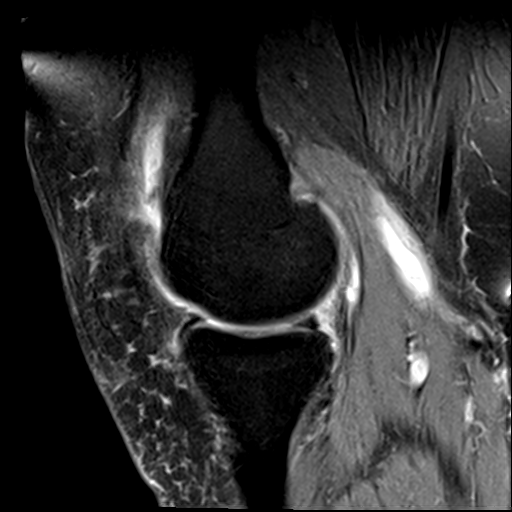
[im 14/23]
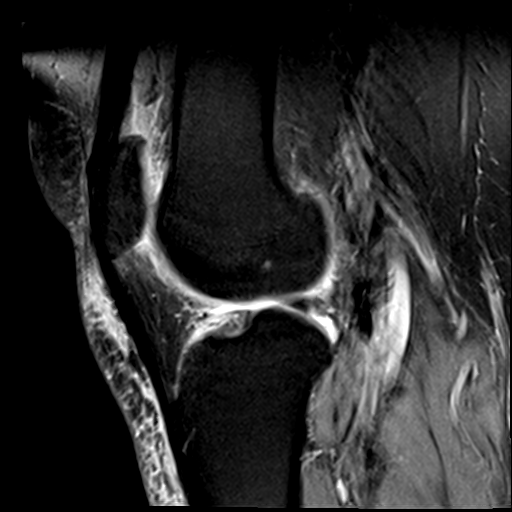
[im 18/23]
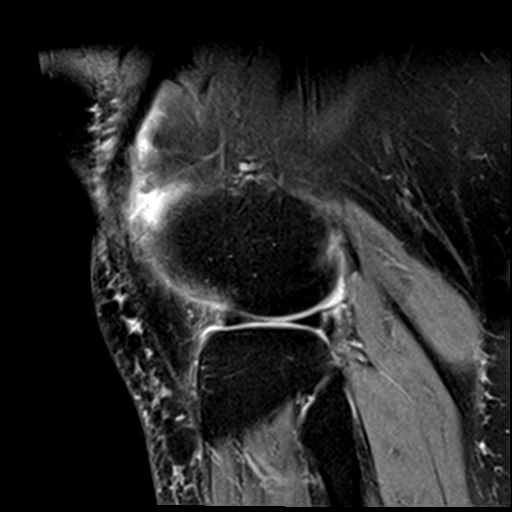
[im 23/23]
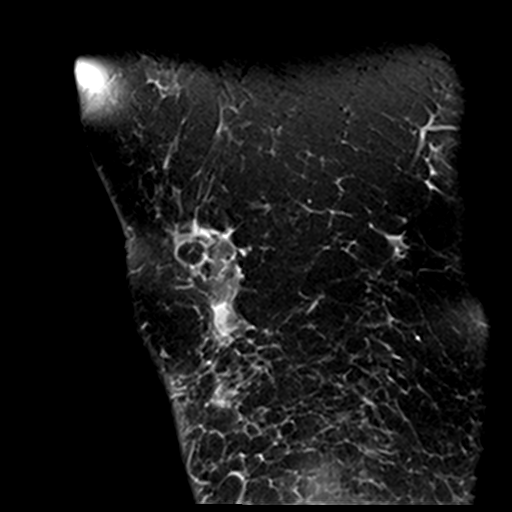

[19 of 40 positions shown; findings below may reference images not displayed]

FINDINGS: MENISCI

Medial meniscus:  Intact.

Lateral meniscus: A horizontal tear in the posterior aspect of the
body reaches the femoral articular surface. There is fraying along
the free edge of the body.

LIGAMENTS

Cruciates:  Intact.

Collaterals: The medial collateral ligament is torn from the femur.
There is edema about the ligament and it has a mildly lax
configuration. Lateral collateral ligament complex is unremarkable.

CARTILAGE

Patellofemoral:  Unremarkable.

Medial:  Mildly degenerated.

Lateral:  Mildly degenerated.

Joint:  Small joint effusion.

Popliteal Fossa: Baker's cyst measuring 3.0 cm transverse by 0.6 cm
AP by 4.7 cm craniocaudal is identified.

Extensor Mechanism:  Intact.

Bones:  Normal marrow signal throughout.
IMPRESSION: Complete tear of the medial collateral ligament from its attachment
to the femur.

Horizontal tear posterior body of the lateral meniscus reaches the
femoral articular surface.

Small Baker's cyst.

## 2017-12-15 DIAGNOSIS — M1712 Unilateral primary osteoarthritis, left knee: Secondary | ICD-10-CM | POA: Diagnosis not present

## 2018-02-13 DIAGNOSIS — C8400 Mycosis fungoides, unspecified site: Secondary | ICD-10-CM | POA: Diagnosis not present

## 2018-02-13 DIAGNOSIS — L304 Erythema intertrigo: Secondary | ICD-10-CM | POA: Diagnosis not present

## 2018-02-23 ENCOUNTER — Ambulatory Visit: Payer: BC Managed Care – PPO | Admitting: Podiatry

## 2018-03-28 ENCOUNTER — Ambulatory Visit: Payer: BC Managed Care – PPO | Admitting: Podiatry

## 2018-03-30 DIAGNOSIS — N393 Stress incontinence (female) (male): Secondary | ICD-10-CM | POA: Diagnosis not present

## 2018-03-30 DIAGNOSIS — I10 Essential (primary) hypertension: Secondary | ICD-10-CM | POA: Diagnosis not present

## 2018-03-30 DIAGNOSIS — E039 Hypothyroidism, unspecified: Secondary | ICD-10-CM | POA: Diagnosis not present

## 2018-04-17 ENCOUNTER — Encounter: Payer: Self-pay | Admitting: Podiatry

## 2018-04-17 ENCOUNTER — Ambulatory Visit (INDEPENDENT_AMBULATORY_CARE_PROVIDER_SITE_OTHER): Payer: BC Managed Care – PPO | Admitting: Podiatry

## 2018-04-17 ENCOUNTER — Ambulatory Visit (INDEPENDENT_AMBULATORY_CARE_PROVIDER_SITE_OTHER): Payer: BC Managed Care – PPO

## 2018-04-17 DIAGNOSIS — M799 Soft tissue disorder, unspecified: Secondary | ICD-10-CM

## 2018-04-17 DIAGNOSIS — M7661 Achilles tendinitis, right leg: Secondary | ICD-10-CM

## 2018-04-17 DIAGNOSIS — M7751 Other enthesopathy of right foot: Secondary | ICD-10-CM

## 2018-04-17 MED ORDER — METHYLPREDNISOLONE 4 MG PO TBPK: ORAL_TABLET | ORAL | 0 refills | Status: DC

## 2018-04-17 NOTE — Progress Notes (Signed)
.   This patient presents the office today for preventative foot care services.  She says she is experiencing on and off pain at a hard growth on the back of her right ankle.  She says this pain felt area has been painful on and off for months.  She says this knot develop 6 months ago and she has been continuing to be active since the pain is intermittent.  She presents the office today requesting that this painful knot will be examined and x-rayed and no treatment of the nails be performed at this visit.  General Appearance  Alert, conversant and in no acute stress.  Vascular  Dorsalis pedis and posterior tibial  pulses are palpable  bilaterally.  Capillary return is within normal limits  bilaterally. Temperature is within normal limits  bilaterally.  Neurologic  Senn-Weinstein monofilament wire test within normal limits  bilaterally. Muscle power within normal limits bilaterally.  Nails Thick disfigured discolored nails with subungual debris  from hallux to fifth toes bilaterally. No evidence of bacterial infection or drainage bilaterally.  Orthopedic  No limitations of motion of motion feet .  No crepitus or effusions noted.   Bony exostosis noted at the insertion of the Achilles tendon, right foot.  There is a thickening noted to the Achilles tendon of the right foot.  . No redness or inflammation noted.  Pain elicited upon palpation retrocalcaneal spur.  Skin  normotropic skin with no porokeratosis noted bilaterally.  No signs of infections or ulcers noted.   Retrocalcaneal bursitis right  Achilles tendinitis right.  Return office visit. X-rays taken do reveal calcification at the insertion of the Achilles tendon right heel.  Due to the thickening of the tendon, I was against injecting and immobilizing this area.   I prescribed a Medrol Dosepak for this patient.  Told her to return to her power step insoles to walk.  Return to the clinic in 3 weeks for further evaluation and  treatment   Gardiner Barefoot DPM

## 2018-04-23 ENCOUNTER — Telehealth: Payer: Self-pay | Admitting: Podiatry

## 2018-04-23 NOTE — Telephone Encounter (Signed)
Pt states she opened the box, but did not read the instructions. I told pt to read the instructions on the card and begin on a full day. Pt states she now sees the instructions and will start on a full day.

## 2018-04-23 NOTE — Telephone Encounter (Signed)
Pt received a prescription called methylPREDNISolone And she is not sure how to take it. Please give her a call.

## 2018-04-30 DIAGNOSIS — I10 Essential (primary) hypertension: Secondary | ICD-10-CM | POA: Diagnosis not present

## 2018-05-09 ENCOUNTER — Ambulatory Visit: Payer: BC Managed Care – PPO | Admitting: Podiatry

## 2018-06-06 DIAGNOSIS — R928 Other abnormal and inconclusive findings on diagnostic imaging of breast: Secondary | ICD-10-CM | POA: Diagnosis not present

## 2018-06-12 ENCOUNTER — Encounter

## 2018-06-12 ENCOUNTER — Ambulatory Visit (INDEPENDENT_AMBULATORY_CARE_PROVIDER_SITE_OTHER): Payer: BC Managed Care – PPO | Admitting: Podiatry

## 2018-06-12 ENCOUNTER — Encounter: Payer: Self-pay | Admitting: Podiatry

## 2018-06-12 DIAGNOSIS — M79675 Pain in left toe(s): Secondary | ICD-10-CM

## 2018-06-12 DIAGNOSIS — M7661 Achilles tendinitis, right leg: Secondary | ICD-10-CM | POA: Diagnosis not present

## 2018-06-12 DIAGNOSIS — M7751 Other enthesopathy of right foot: Secondary | ICD-10-CM

## 2018-06-12 DIAGNOSIS — B351 Tinea unguium: Secondary | ICD-10-CM

## 2018-06-12 DIAGNOSIS — M79674 Pain in right toe(s): Secondary | ICD-10-CM

## 2018-06-12 MED ORDER — MELOXICAM 7.5 MG PO TABS: 7.5000 mg | ORAL_TABLET | Freq: Every day | ORAL | 0 refills | Status: DC

## 2018-06-12 NOTE — Progress Notes (Signed)
.   This patient presents the office today for preventative foot care services.  She says she was treated for achilles tendinitis last visit in lieu of her nail treatment.  She says she was given a medrol dosepak which she took to completion. She says she is 90% improved.  She still wears her powersteps depending on her foot swelling.  She says she would like to have preventative foot care services today.  General Appearance  Alert, conversant and in no acute stress.  Vascular  Dorsalis pedis and posterior tibial  pulses are palpable  bilaterally.  Capillary return is within normal limits  bilaterally. Temperature is within normal limits  bilaterally.  Neurologic  Senn-Weinstein monofilament wire test within normal limits  bilaterally. Muscle power within normal limits bilaterally.  Nails Thick disfigured discolored nails with subungual debris  from hallux to fifth toes bilaterally. No evidence of bacterial infection or drainage bilaterally.  Orthopedic  No limitations of motion of motion feet .  No crepitus or effusions noted.   Bony exostosis noted at the insertion of the Achilles tendon, right foot.  There is a thickening noted to the Achilles tendon of the right foot.  . No redness or inflammation.  Pain has resolved.    Skin  normotropic skin with no porokeratosis noted bilaterally.  No signs of infections or ulcers noted.   Retrocalcaneal bursitis right  Achilles tendinitis right.  Return office visit for discussion of her achilles tendinitis.  Debridement of onychomycosis  X 10 was performed.  RTC for preventative foot care services in 3 months. Prescribed Mobic 7.5 mg.   Gardiner Barefoot DPM

## 2018-07-04 ENCOUNTER — Other Ambulatory Visit: Payer: Self-pay | Admitting: Podiatry

## 2018-08-01 DIAGNOSIS — I10 Essential (primary) hypertension: Secondary | ICD-10-CM | POA: Diagnosis not present

## 2018-08-01 DIAGNOSIS — K635 Polyp of colon: Secondary | ICD-10-CM | POA: Diagnosis not present

## 2018-08-01 DIAGNOSIS — N393 Stress incontinence (female) (male): Secondary | ICD-10-CM | POA: Diagnosis not present

## 2018-08-01 DIAGNOSIS — E039 Hypothyroidism, unspecified: Secondary | ICD-10-CM | POA: Diagnosis not present

## 2018-08-01 DIAGNOSIS — R454 Irritability and anger: Secondary | ICD-10-CM | POA: Diagnosis not present

## 2018-08-03 ENCOUNTER — Encounter: Payer: Self-pay | Admitting: Internal Medicine

## 2018-08-14 DIAGNOSIS — R0902 Hypoxemia: Secondary | ICD-10-CM | POA: Diagnosis not present

## 2018-08-14 DIAGNOSIS — B079 Viral wart, unspecified: Secondary | ICD-10-CM | POA: Diagnosis not present

## 2018-08-14 DIAGNOSIS — C44709 Unspecified malignant neoplasm of skin of left lower limb, including hip: Secondary | ICD-10-CM | POA: Diagnosis not present

## 2018-08-14 DIAGNOSIS — R0602 Shortness of breath: Secondary | ICD-10-CM | POA: Diagnosis not present

## 2018-08-31 ENCOUNTER — Encounter: Payer: Self-pay | Admitting: Internal Medicine

## 2018-08-31 ENCOUNTER — Ambulatory Visit (INDEPENDENT_AMBULATORY_CARE_PROVIDER_SITE_OTHER): Payer: Medicare Other | Admitting: Internal Medicine

## 2018-08-31 VITALS — BP 122/68 | HR 84 | Ht 62.5 in | Wt 244.0 lb

## 2018-08-31 DIAGNOSIS — K5900 Constipation, unspecified: Secondary | ICD-10-CM | POA: Diagnosis not present

## 2018-08-31 DIAGNOSIS — R194 Change in bowel habit: Secondary | ICD-10-CM

## 2018-08-31 DIAGNOSIS — Z1211 Encounter for screening for malignant neoplasm of colon: Secondary | ICD-10-CM

## 2018-08-31 MED ORDER — NA SULFATE-K SULFATE-MG SULF 17.5-3.13-1.6 GM/177ML PO SOLN: 1.0000 | Freq: Once | ORAL | 0 refills | Status: AC

## 2018-08-31 NOTE — Patient Instructions (Signed)

## 2018-08-31 NOTE — Progress Notes (Signed)
HISTORY OF PRESENT ILLNESS:  [name] is a 77 y.o. 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 Nyoka Cowden regarding issues with her bowel habits and the need for screening colonoscopy.  Patient has not been seen in this office since December 2009.  At that time she was evaluated for flatulence, rectal bleeding, and a history of colon polyps.  At that same time she underwent complete colonoscopy.  This was normal except for mild sigmoid diverticulosis.  Routine follow-up in 10 years recommended.  Patient tells me that her bowel habits have changed from normal or oversized bowel movements to multiple small balls.  She describes her stools as dry.  She tells me that she falls asleep after eating.  She tells me that her baseline bowel habits are usually once per day after getting up in the morning.  Now she describes 2 bowel movement movements per day.  One in the morning and 1 after becoming active such as walking for distances.  She also tells me that she has to urinate when she moves her bowels.  She reports increased flatus which is embarrassing.  She tells me that she has been straining with her bowel movements.  At times this causes a throbbing sensation in the rectum.  She denies bleeding.  No interval family history of colon cancer.  Review of outside blood work.  Last CBC normal with hemoglobin 12.6.  No relevant abnormalities in the x-ray file.  REVIEW OF SYSTEMS:  All non-GI ROS negative unless otherwise stated in the HPI except for arthritis, back pain, fatigue, shortness of breath, sleeping problems, ankle swelling, urinary leakage  Past Medical History:  Diagnosis Date  . Arthritis   . Asthma   . Bradycardia   . Chest pain    Nuclear, May, 2010, no scar or ischemia  . Dyslipidemia   . Ejection fraction    EF 60%, May, 2010, echo  . Heart murmur   . Hemorrhoids   . Herpes simplex without mention of complication   . History of  skin cancer   . HTN (hypertension)   . Hx of colonic polyps   . IBS (irritable bowel syndrome)   . Overweight(278.02)   . Pneumonia   . Tachycardia    Rapid heart beat, October, 2011, 21 day monitor, sinus rhythm, patient senses mild increase in heart rate, low-dose beta blockade try    Past Surgical History:  Procedure Laterality Date  . APPENDECTOMY    . CATARACT EXTRACTION Bilateral   . OVARY SURGERY    . PARTIAL HYSTERECTOMY    . TONSILLECTOMY    . vocal cord surgery     polyp removed    Social History [name]  reports that she quit smoking about 32 years ago. She has a 30.00 pack-year smoking history. She has never used smokeless tobacco. She reports that she does not drink alcohol or use drugs.  family history includes Breast cancer in her cousin; Diabetes in her father; Hypertension in her father and mother; Ovarian cancer in her maternal grandmother; Stroke in her mother.  Allergies  Allergen Reactions  . Methylphenidate Derivatives     Makes breast enlarged       PHYSICAL EXAMINATION: Vital signs: BP 122/68 (BP Location: Left Arm, Patient Position: Sitting, Cuff Size: Large)   Pulse 84   Ht 5' 2.5" (1.588 m)   Wt 244 lb (110.7 kg)   BMI 43.92 kg/m   Constitutional:  Pleasant, obese, generally well-appearing, no acute distress Psychiatric: alert and oriented x3, cooperative Eyes: extraocular movements intact, anicteric, conjunctiva pink Mouth: oral pharynx moist, no lesions Neck: supple no lymphadenopathy Cardiovascular: heart regular rate and rhythm, no murmur Lungs: clear to auscultation bilaterally Abdomen: soft, obese, nontender, nondistended, no obvious ascites, no peritoneal signs, normal bowel sounds, no organomegaly Rectal: Deferred until colonoscopy Extremities: no clubbing, cyanosis,.  Trace lower extremity edema bilaterally Skin: no lesions on visible extremities Neuro: No focal deficits.  Cranial nerves intact  ASSESSMENT:  1.   Change in bowel habits with tendency toward constipation.  No alarm features 2.  Colonoscopy 10 years ago with mild diverticulosis   PLAN:  1.  Try daily fiber supplementation with Metamucil 2 tablespoons and 16 ounces of water or juice 2.  Schedule screening colonoscopy.  This to evaluate change in bowel habits and provide repeat neoplasia screening.  The patient is high risk given her body habitus.  She will need monitored anesthesia care for her sedation.The nature of the procedure, as well as the risks, benefits, and alternatives were carefully and thoroughly reviewed with the patient. Ample time for discussion and questions allowed. The patient understood, was satisfied, and agreed to proceed.  A copy of this consultation note has been sent to Dr. Nyoka Cowden

## 2018-09-05 DIAGNOSIS — R350 Frequency of micturition: Secondary | ICD-10-CM | POA: Diagnosis not present

## 2018-09-05 DIAGNOSIS — N3946 Mixed incontinence: Secondary | ICD-10-CM | POA: Diagnosis not present

## 2018-09-05 DIAGNOSIS — R351 Nocturia: Secondary | ICD-10-CM | POA: Diagnosis not present

## 2018-09-11 ENCOUNTER — Ambulatory Visit: Payer: BC Managed Care – PPO | Admitting: Podiatry

## 2018-10-05 ENCOUNTER — Ambulatory Visit (INDEPENDENT_AMBULATORY_CARE_PROVIDER_SITE_OTHER): Payer: BC Managed Care – PPO | Admitting: Podiatry

## 2018-10-05 ENCOUNTER — Encounter: Payer: Self-pay | Admitting: Podiatry

## 2018-10-05 DIAGNOSIS — M79675 Pain in left toe(s): Secondary | ICD-10-CM | POA: Diagnosis not present

## 2018-10-05 DIAGNOSIS — N393 Stress incontinence (female) (male): Secondary | ICD-10-CM | POA: Diagnosis not present

## 2018-10-05 DIAGNOSIS — M7661 Achilles tendinitis, right leg: Secondary | ICD-10-CM

## 2018-10-05 DIAGNOSIS — A630 Anogenital (venereal) warts: Secondary | ICD-10-CM | POA: Diagnosis not present

## 2018-10-05 DIAGNOSIS — M79674 Pain in right toe(s): Secondary | ICD-10-CM

## 2018-10-05 DIAGNOSIS — B351 Tinea unguium: Secondary | ICD-10-CM

## 2018-10-05 DIAGNOSIS — I10 Essential (primary) hypertension: Secondary | ICD-10-CM | POA: Diagnosis not present

## 2018-10-05 NOTE — Progress Notes (Signed)
.   This patient presents the office today for preventative foot care services.  She says the nails on both feet are painful walking and wearing her shoes.  She is unable to self treat.  She says she would like to have preventative foot care services today.  General Appearance  Alert, conversant and in no acute stress.  Vascular  Dorsalis pedis and posterior tibial  pulses are palpable  bilaterally.  Capillary return is within normal limits  bilaterally. Temperature is within normal limits  bilaterally.  Neurologic  Senn-Weinstein monofilament wire test within normal limits  bilaterally. Muscle power within normal limits bilaterally.  Nails Thick disfigured discolored nails with subungual debris  from hallux to fifth toes bilaterally. No evidence of bacterial infection or drainage bilaterally.  Orthopedic  No limitations of motion of motion feet .  No crepitus or effusions noted.   Bony exostosis noted at the insertion of the Achilles tendon, right foot.  There is a thickening noted to the Achilles tendon of the right foot.  . No redness or inflammation.  Pain has resolved.    Skin  normotropic skin with no porokeratosis noted bilaterally.  No signs of infections or ulcers noted.   Onychomycosis  B/L     Debridement of onychomycosis  X 10 was performed.  RTC for preventative foot care services in 3 months.   Gardiner Barefoot DPM

## 2018-10-15 ENCOUNTER — Telehealth: Payer: Self-pay | Admitting: Internal Medicine

## 2018-10-15 NOTE — Telephone Encounter (Signed)
Pt called and states she takes lasix 40mg  daily and wanted to know if she needed to hold it the day before or day of the procedure. Discussed with pt that we do not have pts hold lasix the day before the procedure. Pt is going to hold her lasix the day of the procedure and take it once she is at home.

## 2018-10-15 NOTE — Telephone Encounter (Signed)
Pt has several questions regarding side effects of suprep.

## 2018-10-17 ENCOUNTER — Ambulatory Visit (AMBULATORY_SURGERY_CENTER): Payer: Medicare Other | Admitting: Internal Medicine

## 2018-10-17 ENCOUNTER — Encounter: Payer: Self-pay | Admitting: Internal Medicine

## 2018-10-17 DIAGNOSIS — D122 Benign neoplasm of ascending colon: Secondary | ICD-10-CM

## 2018-10-17 DIAGNOSIS — I10 Essential (primary) hypertension: Secondary | ICD-10-CM | POA: Diagnosis not present

## 2018-10-17 DIAGNOSIS — K5900 Constipation, unspecified: Secondary | ICD-10-CM | POA: Diagnosis not present

## 2018-10-17 DIAGNOSIS — Z1211 Encounter for screening for malignant neoplasm of colon: Secondary | ICD-10-CM | POA: Diagnosis not present

## 2018-10-17 DIAGNOSIS — R194 Change in bowel habit: Secondary | ICD-10-CM | POA: Diagnosis not present

## 2018-10-17 MED ORDER — SODIUM CHLORIDE 0.9 % IV SOLN: 500.0000 mL | Freq: Once | INTRAVENOUS | Status: DC

## 2018-10-17 NOTE — Progress Notes (Signed)
Report to PACU, RN, vss, BBS= Clear.  

## 2018-10-17 NOTE — Progress Notes (Signed)
Called to room to assist during endoscopic procedure.  Patient ID and intended procedure confirmed with present staff. Received instructions for my participation in the procedure from the performing physician.  

## 2018-10-17 NOTE — Patient Instructions (Signed)
YOU HAD AN ENDOSCOPIC PROCEDURE TODAY AT THE [facility]:   Refer to the procedure report that was given to you for any specific questions about what was found during the examination.  If the procedure report does not answer your questions, please call your gastroenterologist to clarify.  If you requested that your care partner not be given the details of your procedure findings, then the procedure report has been included in a sealed envelope for you to review at your convenience later.  YOU SHOULD EXPECT: Some feelings of bloating in the abdomen. Passage of more gas than usual.  Walking can help get rid of the air that was put into your GI tract during the procedure and reduce the bloating. If you had a lower endoscopy (such as a colonoscopy or flexible sigmoidoscopy) you may notice spotting of blood in your stool or on the toilet paper. If you underwent a bowel prep for your procedure, you may not have a normal bowel movement for a few days.  Please Note:  You might notice some irritation and congestion in your nose or some drainage.  This is from the oxygen used during your procedure.  There is no need for concern and it should clear up in a day or so.  SYMPTOMS TO REPORT IMMEDIATELY:   Following lower endoscopy (colonoscopy or flexible sigmoidoscopy):  Excessive amounts of blood in the stool  Significant tenderness or worsening of abdominal pains  Swelling of the abdomen that is new, acute  Fever of 100F or higher  For urgent or emergent issues, a gastroenterologist can be reached at any hour by calling [phone].   DIET:  We do recommend a small meal at first, but then you may proceed to your regular diet.  Drink plenty of fluids but you should avoid alcoholic beverages for 24 hours.  ACTIVITY:  You should plan to take it easy for the rest of today and you should NOT DRIVE or use heavy machinery until tomorrow (because of the sedation medicines used during the test).     FOLLOW UP: Our staff will call the number listed on your records the next business day following your procedure to check on you and address any questions or concerns that you may have regarding the information given to you following your procedure. If we do not reach you, we will leave a message.  However, if you are feeling well and you are not experiencing any problems, there is no need to return our call.  We will assume that you have returned to your regular daily activities without incident.  If any biopsies were taken you will be contacted by phone or by letter within the next 1-3 weeks.  Please call us at [phone] if you have not heard about the biopsies in 3 weeks.   Await for biopsy results Polyps (handout given) Diverticulosis (handout given) Hemorrhoids (handout given)   SIGNATURES/CONFIDENTIALITY: You and/or your care partner have signed paperwork which will be entered into your electronic medical record.  These signatures attest to the fact that that the information above on your After Visit Summary has been reviewed and is understood.  Full responsibility of the confidentiality of this discharge information lies with you and/or your care-partner. 

## 2018-10-17 NOTE — Op Note (Signed)
[facility] Patient Name: [name] Procedure Date: 10/17/2018 11:04 AM MRN: [id number] Endoscopist: Docia Chuck. Henrene Pastor , MD Age: 78 Referring MD:  Date of Birth: [birth year] Gender: Female Account #: [id number] Procedure:                Colonoscopy with cold snare polypectomy x 1 Indications:              Screening for colorectal malignant neoplasm. Index                            examination 2009- for neoplasia Medicines:                Monitored Anesthesia Care Procedure:                Pre-Anesthesia Assessment:                           - Prior to the procedure, a History and Physical                            was performed, and patient medications and                            allergies were reviewed. The patient's tolerance of                            previous anesthesia was also reviewed. The risks                            and benefits of the procedure and the sedation                            options and risks were discussed with the patient.                            All questions were answered, and informed consent                            was obtained. Prior Anticoagulants: The patient has                            taken no previous anticoagulant or antiplatelet                            agents. ASA Grade Assessment: II - A patient with                            mild systemic disease. After reviewing the risks                            and benefits, the patient was deemed in                            satisfactory condition to undergo the procedure.  After obtaining informed consent, the colonoscope                            was passed under direct vision. Throughout the                            procedure, the patient's blood pressure, pulse, and                            oxygen saturations were monitored continuously. The                            Model CF-HQ190L [id number]) scope was introduced         through the anus and advanced to the the cecum,                            identified by appendiceal orifice and ileocecal                            valve. The ileocecal valve, appendiceal orifice,                            and rectum were photographed. The quality of the                            bowel preparation was excellent. The colonoscopy                            was performed without difficulty. The patient                            tolerated the procedure well. The bowel preparation                            used was SUPREP. Scope In: 11:14:12 AM Scope Out: 11:30:12 AM Scope Withdrawal Time: 0 hours 12 minutes 53 seconds  Total Procedure Duration: 0 hours 16 minutes 0 seconds  Findings:                 A 2 mm polyp was found in the ascending colon. The                            polyp was removed with a cold snare. Resection and                            retrieval were complete.                           A few small-mouthed diverticula were found in the                            sigmoid colon.                           Internal  hemorrhoids were found during retroflexion.                           The exam was otherwise without abnormality on                            direct and retroflexion views. Complications:            No immediate complications. Estimated blood loss:                            None. Estimated Blood Loss:     Estimated blood loss: none. Impression:               - One 2 mm polyp in the ascending colon, removed                            with a cold snare. Resected and retrieved.                           - Diverticulosis in the sigmoid colon.                           - Internal hemorrhoids.                           - The examination was otherwise normal on direct                            and retroflexion views. Recommendation:           - Repeat colonoscopy is not recommended for                            surveillance.                            - Patient has a contact number available for                            emergencies. The signs and symptoms of potential                            delayed complications were discussed with the                            patient. Return to normal activities tomorrow.                            Written discharge instructions were provided to the                            patient.                           - Resume previous diet. Increase fiber around water  consumption.                           - Continue present medications.                           - Await pathology results. Docia Chuck. Henrene Pastor, MD 10/17/2018 11:37:01 AM This report has been signed electronically.

## 2018-10-18 ENCOUNTER — Telehealth: Payer: Self-pay

## 2018-10-18 NOTE — Telephone Encounter (Signed)
  Follow up Call-  Call back number 10/17/2018  Post procedure Call Back phone  # [phone]  Permission to leave phone message Yes  Some recent data might be hidden     Patient questions:  Do you have a fever, pain , or abdominal swelling? No. Pain Score  0 *  Have you tolerated food without any problems? Yes.    Have you been able to return to your normal activities? Yes.    Do you have any questions about your discharge instructions: Diet   No. Medications  No. Follow up visit  No.  Do you have questions or concerns about your Care? No.  Actions: * If pain score is 4 or above: No action needed, pain <4.  No problems noted per pt. maw

## 2018-10-23 ENCOUNTER — Encounter: Payer: Self-pay | Admitting: Internal Medicine

## 2018-10-24 DIAGNOSIS — N3946 Mixed incontinence: Secondary | ICD-10-CM | POA: Diagnosis not present

## 2018-10-24 DIAGNOSIS — R350 Frequency of micturition: Secondary | ICD-10-CM | POA: Diagnosis not present

## 2018-11-01 DIAGNOSIS — N3946 Mixed incontinence: Secondary | ICD-10-CM | POA: Diagnosis not present

## 2018-11-14 DIAGNOSIS — H40003 Preglaucoma, unspecified, bilateral: Secondary | ICD-10-CM | POA: Diagnosis not present

## 2018-11-29 DIAGNOSIS — K635 Polyp of colon: Secondary | ICD-10-CM | POA: Diagnosis not present

## 2018-11-29 DIAGNOSIS — I10 Essential (primary) hypertension: Secondary | ICD-10-CM | POA: Diagnosis not present

## 2018-11-29 DIAGNOSIS — N393 Stress incontinence (female) (male): Secondary | ICD-10-CM | POA: Diagnosis not present

## 2019-01-01 ENCOUNTER — Ambulatory Visit: Payer: BC Managed Care – PPO | Admitting: Podiatry

## 2019-01-04 ENCOUNTER — Ambulatory Visit: Payer: BC Managed Care – PPO | Admitting: Podiatry

## 2019-02-13 ENCOUNTER — Ambulatory Visit (INDEPENDENT_AMBULATORY_CARE_PROVIDER_SITE_OTHER): Payer: BC Managed Care – PPO | Admitting: Podiatry

## 2019-02-13 ENCOUNTER — Encounter: Payer: Self-pay | Admitting: Podiatry

## 2019-02-13 ENCOUNTER — Other Ambulatory Visit: Payer: Self-pay

## 2019-02-13 VITALS — Temp 96.4°F

## 2019-02-13 DIAGNOSIS — M79674 Pain in right toe(s): Secondary | ICD-10-CM | POA: Diagnosis not present

## 2019-02-13 DIAGNOSIS — L84 Corns and callosities: Secondary | ICD-10-CM

## 2019-02-13 DIAGNOSIS — M79675 Pain in left toe(s): Secondary | ICD-10-CM | POA: Diagnosis not present

## 2019-02-13 DIAGNOSIS — B351 Tinea unguium: Secondary | ICD-10-CM | POA: Diagnosis not present

## 2019-02-13 NOTE — Progress Notes (Signed)
.   This patient presents the office today for preventative foot care services.  She says the nails on both feet are painful walking and wearing her shoes.  She is unable to self treat.  She says she would like to have preventative foot care services today.  General Appearance  Alert, conversant and in no acute stress.  Vascular  Dorsalis pedis and posterior tibial  pulses are palpable  bilaterally.  Capillary return is within normal limits  bilaterally. Temperature is within normal limits  bilaterally.  Neurologic  Senn-Weinstein monofilament wire test within normal limits  bilaterally. Muscle power within normal limits bilaterally.  Nails Thick disfigured discolored nails with subungual debris  from hallux to fifth toes bilaterally. No evidence of bacterial infection or drainage bilaterally.  Orthopedic  No limitations of motion of motion feet .  No crepitus or effusions noted.   Bony exostosis noted at the insertion of the Achilles tendon, right foot.  There is a thickening noted to the Achilles tendon of the right foot.  . No redness or inflammation.  Pain has resolved.    Skin  normotropic skin with no porokeratosis noted bilaterally.  No signs of infections or ulcers noted. Heloma durum 5th digit right foot.   Onychomycosis  B/L  HD 5th right     Debridement of onychomycosis  X 10 was performed. Debride corn.  RTC for preventative foot care services in 3 months.   Gardiner Barefoot DPM

## 2019-04-05 DIAGNOSIS — C8400 Mycosis fungoides, unspecified site: Secondary | ICD-10-CM | POA: Diagnosis not present

## 2019-04-05 DIAGNOSIS — L304 Erythema intertrigo: Secondary | ICD-10-CM | POA: Diagnosis not present

## 2019-04-25 ENCOUNTER — Other Ambulatory Visit: Payer: Self-pay

## 2019-04-25 ENCOUNTER — Emergency Department (HOSPITAL_COMMUNITY): Payer: Medicare Other

## 2019-04-25 ENCOUNTER — Emergency Department (HOSPITAL_COMMUNITY)
Admission: EM | Admit: 2019-04-25 | Discharge: 2019-04-25 | Disposition: A | Discharge status: Home / Self Care | Payer: Medicare Other | Attending: Emergency Medicine | Admitting: Emergency Medicine

## 2019-04-25 DIAGNOSIS — Z87891 Personal history of nicotine dependence: Secondary | ICD-10-CM | POA: Insufficient documentation

## 2019-04-25 DIAGNOSIS — Z20828 Contact with and (suspected) exposure to other viral communicable diseases: Secondary | ICD-10-CM | POA: Diagnosis not present

## 2019-04-25 DIAGNOSIS — R0989 Other specified symptoms and signs involving the circulatory and respiratory systems: Secondary | ICD-10-CM | POA: Diagnosis not present

## 2019-04-25 DIAGNOSIS — I442 Atrioventricular block, complete: Secondary | ICD-10-CM | POA: Diagnosis not present

## 2019-04-25 DIAGNOSIS — R0600 Dyspnea, unspecified: Secondary | ICD-10-CM | POA: Diagnosis not present

## 2019-04-25 DIAGNOSIS — J45909 Unspecified asthma, uncomplicated: Secondary | ICD-10-CM | POA: Insufficient documentation

## 2019-04-25 DIAGNOSIS — I499 Cardiac arrhythmia, unspecified: Secondary | ICD-10-CM | POA: Diagnosis not present

## 2019-04-25 DIAGNOSIS — Z79899 Other long term (current) drug therapy: Secondary | ICD-10-CM | POA: Diagnosis not present

## 2019-04-25 DIAGNOSIS — I10 Essential (primary) hypertension: Secondary | ICD-10-CM | POA: Insufficient documentation

## 2019-04-25 DIAGNOSIS — R0602 Shortness of breath: Secondary | ICD-10-CM | POA: Diagnosis not present

## 2019-04-25 DIAGNOSIS — R001 Bradycardia, unspecified: Secondary | ICD-10-CM | POA: Diagnosis not present

## 2019-04-25 LAB — CBC
HCT: 37.9 % (ref 36.0–46.0)
Hemoglobin: 12.3 g/dL (ref 12.0–15.0)
MCH: 31.1 pg (ref 26.0–34.0)
MCHC: 32.5 g/dL (ref 30.0–36.0)
MCV: 95.7 fL (ref 80.0–100.0)
Platelets: 190 10*3/uL (ref 150–400)
RBC: 3.96 MIL/uL (ref 3.87–5.11)
RDW: 12.6 % (ref 11.5–15.5)
WBC: 10.3 10*3/uL (ref 4.0–10.5)
nRBC: 0 % (ref 0.0–0.2)

## 2019-04-25 LAB — BASIC METABOLIC PANEL
Anion gap: 11 (ref 5–15)
BUN: 14 mg/dL (ref 8–23)
CO2: 26 mmol/L (ref 22–32)
Calcium: 9.2 mg/dL (ref 8.9–10.3)
Chloride: 101 mmol/L (ref 98–111)
Creatinine, Ser: 1.25 mg/dL — ABNORMAL HIGH (ref 0.44–1.00)
GFR calc Af Amer: 48 mL/min — ABNORMAL LOW (ref >60)
GFR calc non Af Amer: 42 mL/min — ABNORMAL LOW (ref >60)
Glucose, Bld: 112 mg/dL — ABNORMAL HIGH (ref 70–99)
Potassium: 4.2 mmol/L (ref 3.5–5.1)
Sodium: 138 mmol/L (ref 135–145)

## 2019-04-25 LAB — TROPONIN I (HIGH SENSITIVITY)
Troponin I (High Sensitivity): 4 ng/L (ref <18)
Troponin I (High Sensitivity): 4 ng/L (ref <18)

## 2019-04-25 MED ORDER — SODIUM CHLORIDE 0.9% FLUSH: 3.0000 mL | Freq: Once | INTRAVENOUS | Status: DC

## 2019-04-25 MED ORDER — SODIUM CHLORIDE 0.9 % IV SOLN: 1.0000 g | Freq: Once | INTRAVENOUS | Status: DC

## 2019-04-25 MED ORDER — CALCIUM GLUCONATE-NACL 1-0.675 GM/50ML-% IV SOLN
1.0000 g | Freq: Once | INTRAVENOUS | Status: AC
  Administered 2019-04-25: 1000 mg via INTRAVENOUS
  Filled 2019-04-25: qty 50

## 2019-04-25 NOTE — Discharge Instructions (Addendum)
You have been seen today for slow heart rate. Please read and follow all provided instructions. Return to the emergency room for worsening condition or new concerning symptoms.    1. Medications:  STOP taking Carvedilol (coreg). This might have caused your low heart rate. Continue other  home medications Take medications as prescribed. Please review all of the medicines and only take them if you do not have an allergy to them.   2. Treatment: rest, drink plenty of fluids, increase water intake.  3. Follow Up: Please follow up with your primary doctor in 1-2 days for discussion of your diagnoses and further evaluation after today's visit; Do not take your Carvedilol (coreg) until you talk to your doctor about it. Call tomorrow morning to arrange follow up.   Your creatinine was elevated today to 1.24. Please take increase your water intake and have your doctor recheck  this within 1 week to make sure it has improved.   ?

## 2019-04-25 NOTE — ED Provider Notes (Signed)
[facility] EMERGENCY DEPARTMENT Provider Note   CSN: [id number] Arrival date & time: 04/25/19  1620    History   Chief Complaint Chief Complaint  Patient presents with   Bradycardia    HPI [name] is a 78 y.o. female with past medical history of anemia, bradycardia, GERD, hypertension presents emergency department today with chief complaint of bradycardia.  Onset was acute happening just prior to arrival.  Patient states when she woke up this morning she felt like her breath was slowed.  So she called her primary care doctor and went in for an appointment.  She states and walking from the car to the office she got winded and diaphoretic.  PCP was concerned for heart block and sent her to the ED.  She states she had a similar episode of bradycardia in 2010.  She was reports being hospitalized for a few days without any interventions.  Patient reports she still feels like she is breathing slow.  She denies any fever, chills, chest pain, palpitations, abdominal pain, nausea, vomiting, diarrhea, urinary symtpoms. She is not anticoagulated. History provided by patient with additional history obtained from chart review.     Past Medical History:  Diagnosis Date   Anemia    "years ago"   Arthritis    Asthma    Bradycardia    Cataract    "skin Cancer"   Chest pain    Nuclear, May, 2010, no scar or ischemia   Dyslipidemia    Ejection fraction    EF 60%, May, 2010, echo   GERD (gastroesophageal reflux disease)    Heart murmur    Hemorrhoids    Herpes simplex without mention of complication    History of skin cancer    HTN (hypertension)    Hx of colonic polyps    IBS (irritable bowel syndrome)    Overweight(278.02)    Pneumonia    Tachycardia    Rapid heart beat, October, 2011, 21 day monitor, sinus rhythm, patient senses mild increase in heart rate, low-dose beta blockade try    Patient Active Problem List   Diagnosis Date  Noted   HTN (hypertension) 12/27/2011   Mycosis fungoides ([place]) 12/27/2011   Urinary incontinence 12/27/2011   Chest pain    Ejection fraction    Tachycardia    Dyslipidemia    PALPITATIONS 07/22/2010   DYSLIPIDEMIA 02/17/2009   OBESITY, MORBID 02/17/2009   BRADYCARDIA 02/17/2009   SKIN CANCER, HX OF 02/17/2009   HERPES SIMPLEX INFECTION, TYPE I 09/17/2008   HEMORRHOIDS 09/17/2008   IBS 09/17/2008   COLONIC POLYPS, HX OF 09/17/2008    Past Surgical History:  Procedure Laterality Date   APPENDECTOMY     CATARACT EXTRACTION Bilateral    OVARY SURGERY     PARTIAL HYSTERECTOMY     TONSILLECTOMY     vocal cord surgery     polyp removed     OB History   No obstetric history on file.      Home Medications    Prior to Admission medications   Medication Sig Start Date End Date Taking? Authorizing Provider  carvedilol (COREG) 25 MG tablet Take 25 mg by mouth 2 (two) times daily with a meal.    [provider]  Furosemide (LASIX PO) Take 40 mg by mouth daily.     [provider]  losartan (COZAAR) 100 MG tablet losartan 100 mg tablet    [provider]  meloxicam (MOBIC) 7.5 MG tablet TAKE  1 TABLET BY MOUTH EVERY DAY 07/04/18   Gardiner Barefoot, DPM  polyethylene glycol powder (GLYCOLAX/MIRALAX) powder Take 17 g by mouth as needed.    [provider]  SUPREP BOWEL PREP KIT 17.5-3.13-1.6 GM/177ML SOLN  08/31/18   [provider]  triamcinolone cream (KENALOG) 0.1 % triamcinolone acetonide 0.1 % topical cream    [provider]  UNABLE TO FIND triamcinolone-dimeth-silicone    [provider]  verapamil (CALAN-SR) 240 MG CR tablet Take 240 mg by mouth daily. 11/15/17   [provider]    Family History Family History  Problem Relation Age of Onset   Stroke Mother    Hypertension Mother    Diabetes Father    Hypertension Father    Ovarian cancer Maternal Grandmother    Breast  cancer Cousin        x 4    Social History Social History   Tobacco Use   Smoking status: Former Smoker    Packs/day: 1.00    Years: 30.00    Pack years: 30.00    Quit date: 10/10/1985    Years since quitting: 33.5   Smokeless tobacco: Never Used  Substance Use Topics   Alcohol use: No   Drug use: No     Allergies   Methylphenidate derivatives   Review of Systems Review of Systems  Constitutional: Positive for diaphoresis. Negative for chills and fever.  HENT: Negative for congestion, ear discharge, ear pain, sinus pressure, sinus pain and sore throat.   Eyes: Negative for pain and redness.  Respiratory: Positive for shortness of breath. Negative for cough.   Cardiovascular: Negative for chest pain.  Gastrointestinal: Negative for abdominal pain, constipation, diarrhea, nausea and vomiting.  Genitourinary: Negative for dysuria and hematuria.  Musculoskeletal: Negative for back pain and neck pain.  Skin: Negative for wound.  Neurological: Negative for weakness, numbness and headaches.     Physical Exam Updated Vital Signs BP (!) 125/51 (BP Location: Right Arm)    Pulse (!) 43    Temp 98.4 F (36.9 C) (Oral)    SpO2 99%   Physical Exam Vitals signs and nursing note reviewed.  Constitutional:      General: She is not in acute distress.    Appearance: She is not ill-appearing.  HENT:     Head: Normocephalic and atraumatic.     Right Ear: Tympanic membrane and external ear normal.     Left Ear: Tympanic membrane and external ear normal.     Nose: Nose normal.     Mouth/Throat:     Mouth: Mucous membranes are moist.     Pharynx: Oropharynx is clear.  Eyes:     General: No scleral icterus.       Right eye: No discharge.        Left eye: No discharge.     Extraocular Movements: Extraocular movements intact.     Conjunctiva/sclera: Conjunctivae normal.     Pupils: Pupils are equal, round, and reactive to light.  Neck:     Musculoskeletal: Normal range of  motion.     Vascular: No JVD.  Cardiovascular:     Rate and Rhythm: Regular rhythm. Bradycardia present.     Pulses: Normal pulses.          Radial pulses are 2+ on the right side and 2+ on the left side.     Heart sounds: Normal heart sounds.  Pulmonary:     Comments: Lungs clear to auscultation in all fields. Symmetric  chest rise. No wheezing, rales, or rhonchi. Chest:     Chest wall: No tenderness.  Abdominal:     Comments: Abdomen is soft, non-distended, and non-tender in all quadrants. No rigidity, no guarding. No peritoneal signs.  Musculoskeletal: Normal range of motion.     Right lower leg: No edema.     Left lower leg: No edema.  Skin:    General: Skin is warm and dry.     Capillary Refill: Capillary refill takes less than 2 seconds.  Neurological:     Mental Status: She is oriented to person, place, and time.     GCS: GCS eye subscore is 4. GCS verbal subscore is 5. GCS motor subscore is 6.     Comments: Fluent speech, no facial droop.  Psychiatric:        Behavior: Behavior normal.      ED Treatments / Results  Labs (all labs ordered are listed, but only abnormal results are displayed) Labs Reviewed  BASIC METABOLIC PANEL - Abnormal; Notable for the following components:      Result Value   Glucose, Bld 112 (*)    Creatinine, Ser 1.25 (*)    GFR calc non Af Amer 42 (*)    GFR calc Af Amer 48 (*)    All other components within normal limits  CBC  TROPONIN I (HIGH SENSITIVITY)  TROPONIN I (HIGH SENSITIVITY)    EKG  EKG Interpretation  Date/Time:  Thursday April 25 2019 16:22:16 EDT Ventricular Rate:  42 PR Interval:    QRS Duration: 107 QT Interval:  480 QTC Calculation: 402 R Axis:   21 Text Interpretation:  Junctional rhythm Abnormal R-wave progression, early transition Borderline repolarization abnormality Minimal ST elevation, inferior leads changed since 2017 Confirmed by Jola Schmidt [phone]) on 04/25/2019 7:34:39 PM       EKG  Interpretation  Date/Time:  Thursday April 25 2019 18:24:31 EDT Ventricular Rate:  57 PR Interval:    QRS Duration: 104 QT Interval:  466 QTC Calculation: 454 R Axis:   -5 Text Interpretation:  Sinus rhythm Abnormal R-wave progression, early transition Probable left ventricular hypertrophy Borderline T abnormalities, anterior leads Baseline wander in lead(s) V2 p waves now present as compared to earlier ecg, same day Confirmed by Jola Schmidt [phone]) on 04/25/2019 7:35:13 PM       Radiology Dg Chest 2 View  Result Date: 04/25/2019 CLINICAL DATA:  Chest pressure, shortness of breath EXAM: CHEST - 2 VIEW COMPARISON:  11/11/2015 FINDINGS: Heart and mediastinal contours are within normal limits. No focal opacities or effusions. No acute bony abnormality. IMPRESSION: No active cardiopulmonary disease. Electronically Signed   By: Rolm Baptise M.D.   On: 04/25/2019 17:46    Procedures Procedures (including critical care time)  Medications Ordered in ED Medications  sodium chloride flush (NS) 0.9 % injection 3 mL (has no administration in time range)  calcium gluconate 1 g/ 50 mL sodium chloride IVPB (0 g Intravenous Stopped 04/25/19 1828)     Initial Impression / Assessment and Plan / ED Course  I have reviewed the triage vital signs and the nursing notes.  Pertinent labs & imaging results that were available during my care of the patient were reviewed by me and considered in my medical decision making (see chart for details). EKG viewed by myself and attending shows junctional rhythm.  Patient is nontoxic in appearance, she is in no acute distress.  On arrival she is bradycardic with heart rate in the 40s, blood  pressure 125/51.  Lungs are clear to auscultation in all fields.  I viewed her meds and patient is on verapamil possibly contributing to bradycardia.  DDx includes electrolyte abnormality, renal failure.  EKG shows junctional rhythm.  BMP shows elevated creatinine of 1.25.  CBC  unremarkable.  Delta troponin negative.  Repeat EKG is improved, visible P waves and sinus rhythm. On reassessment heart rate is ranging 45-55 and pt reports feeling like her normal self.  I ambulated patient around exam room and she did so with steady gait and without difficulty, no dyspnea reported.  As patient has reassuring labs and repeat EKG feel that she is stable to be discharged home with close PCP follow-up in 1-2 days. Will advise her to stop taking Coreg until she sees pcp. Also will need creatinine rechecked within 1 week. Patient is hemodynamically stable, in NAD, and able to ambulate in the ED. Evaluation does not show pathology that would require ongoing emergent intervention or inpatient treatment. I explained the diagnosis to the patient.  Patient is comfortable with above plan and is stable for discharge at this time. All questions were answered prior to disposition. Strict return precautions for returning to the ED were discussed. The patient was discussed with and seen by Dr. Venora Maples who agrees with the treatment plan.   Vitals:   04/25/19 1900 04/25/19 1915 04/25/19 1930 04/25/19 1946  BP: (!) 161/53 (!) 150/53 (!) 147/59 134/61  Pulse: (!) 49 (!) 53 (!) 48 (!) 53  Resp: '17 15 14 19  '$ Temp:      TempSrc:      SpO2: 100% 98% 99% 100%     This note was prepared using Dragon voice recognition software and may include unintentional dictation errors due to the inherent limitations of voice recognition software.    Final Clinical Impressions(s) / ED Diagnoses   Final diagnoses:  Bradycardia    ED Discharge Orders    None       Flint Melter 04/25/19 2023    Jola Schmidt, MD 04/26/19 0022

## 2019-04-25 NOTE — ED Triage Notes (Signed)
Came in via ems from PCP Dr. Nyoka Cowden; stated was seeing PCP for SOB; reported was sent here d/t abnormal EKG @ Dr. office

## 2019-04-25 NOTE — ED Notes (Signed)
Patient transported to X-ray 

## 2019-04-29 DIAGNOSIS — I442 Atrioventricular block, complete: Secondary | ICD-10-CM | POA: Diagnosis not present

## 2019-04-29 DIAGNOSIS — R51 Headache: Secondary | ICD-10-CM | POA: Diagnosis not present

## 2019-04-29 DIAGNOSIS — I10 Essential (primary) hypertension: Secondary | ICD-10-CM | POA: Diagnosis not present

## 2019-05-22 ENCOUNTER — Ambulatory Visit: Payer: BC Managed Care – PPO | Admitting: Podiatry

## 2019-06-05 ENCOUNTER — Other Ambulatory Visit: Payer: Self-pay

## 2019-06-05 ENCOUNTER — Ambulatory Visit (INDEPENDENT_AMBULATORY_CARE_PROVIDER_SITE_OTHER): Payer: BC Managed Care – PPO | Admitting: Podiatry

## 2019-06-05 ENCOUNTER — Encounter: Payer: Self-pay | Admitting: Podiatry

## 2019-06-05 DIAGNOSIS — M129 Arthropathy, unspecified: Secondary | ICD-10-CM

## 2019-06-05 MED ORDER — MELOXICAM 15 MG PO TABS: 15.0000 mg | ORAL_TABLET | Freq: Every day | ORAL | 0 refills | Status: DC

## 2019-06-05 NOTE — Progress Notes (Signed)
This patient presents the office with chief complaint of pain noted over the top of both feet but especially her left foot.  She points to the area on the top of the middle of her left foot.  She says that the pain has been present for approximately 2-1/2 weeks.  She denies any history of trauma or injury to the foot.  She says she has been taking Mobic 7.5 mg for the last 5 days.  She says this medication has provided no relief.  She presents the office today for an evaluation and treatment of her painful feet.  She also request her toenails be serviced by after telling her she would need to remove her nail polish she decided she did not need nail treatment.    General Appearance  Alert, conversant and in no acute stress.  Vascular  Dorsalis pedis and posterior tibial  pulses are palpable  bilaterally.  Capillary return is within normal limits  bilaterally. Temperature is within normal limits  bilaterally.  Neurologic  Senn-Weinstein monofilament wire test within normal limits  bilaterally. Muscle power within normal limits bilaterally.  Nails Thick disfigured discolored nails with subungual debris  from hallux to fifth toes bilaterally. No evidence of bacterial infection or drainage bilaterally.  Orthopedic  No limitations of motion  feet .  No crepitus or effusions noted.  No bony pathology or digital deformities noted.  Palpable pain noted over the Lisfranc's joint of the left foot especially at the second and third MCJ  left foot.  Skin  normotropic skin with no porokeratosis noted bilaterally.  No signs of infections or ulcers noted.    Chronic arthrpathy Kellie Moor  Joint  B/l  ROV.  Discussed this condition with this patient.  Told her she is experiencing arthritic pain noted in the middle of her feet.  She says she is scheduled for chronic evaluation in the near future and we decided not to treat her with cortisone injection or medication.  Told her to start wearing her power step insoles in  her shoes again.  Prescribed Mobic 15 mg to be taken 1 daily.  Return to the clinic 6 weeks for further evaluation and treatment   Gardiner Barefoot DPM

## 2019-06-11 DIAGNOSIS — I10 Essential (primary) hypertension: Secondary | ICD-10-CM | POA: Diagnosis not present

## 2019-06-11 DIAGNOSIS — I442 Atrioventricular block, complete: Secondary | ICD-10-CM | POA: Diagnosis not present

## 2019-06-11 DIAGNOSIS — Z1231 Encounter for screening mammogram for malignant neoplasm of breast: Secondary | ICD-10-CM | POA: Diagnosis not present

## 2019-06-27 ENCOUNTER — Other Ambulatory Visit: Payer: Self-pay | Admitting: Podiatry

## 2019-07-13 DIAGNOSIS — Z20828 Contact with and (suspected) exposure to other viral communicable diseases: Secondary | ICD-10-CM | POA: Diagnosis not present

## 2019-07-13 DIAGNOSIS — R05 Cough: Secondary | ICD-10-CM | POA: Diagnosis not present

## 2019-07-15 DIAGNOSIS — Z20828 Contact with and (suspected) exposure to other viral communicable diseases: Secondary | ICD-10-CM | POA: Diagnosis not present

## 2019-08-06 ENCOUNTER — Other Ambulatory Visit (HOSPITAL_COMMUNITY): Payer: Self-pay | Admitting: Internal Medicine

## 2019-08-06 DIAGNOSIS — I10 Essential (primary) hypertension: Secondary | ICD-10-CM | POA: Diagnosis not present

## 2019-08-06 DIAGNOSIS — R0989 Other specified symptoms and signs involving the circulatory and respiratory systems: Secondary | ICD-10-CM

## 2019-08-06 DIAGNOSIS — C8400 Mycosis fungoides, unspecified site: Secondary | ICD-10-CM | POA: Diagnosis not present

## 2019-08-06 DIAGNOSIS — K589 Irritable bowel syndrome without diarrhea: Secondary | ICD-10-CM | POA: Diagnosis not present

## 2019-08-06 DIAGNOSIS — K635 Polyp of colon: Secondary | ICD-10-CM | POA: Diagnosis not present

## 2019-08-07 ENCOUNTER — Other Ambulatory Visit: Payer: Self-pay

## 2019-08-07 ENCOUNTER — Ambulatory Visit (HOSPITAL_COMMUNITY)
Admission: RE | Admit: 2019-08-07 | Discharge: 2019-08-07 | Disposition: A | Discharge status: Home / Self Care | Payer: Medicare Other | Source: Ambulatory Visit | Attending: Internal Medicine | Admitting: Internal Medicine

## 2019-08-07 DIAGNOSIS — R0989 Other specified symptoms and signs involving the circulatory and respiratory systems: Secondary | ICD-10-CM | POA: Insufficient documentation

## 2019-08-07 DIAGNOSIS — I10 Essential (primary) hypertension: Secondary | ICD-10-CM | POA: Diagnosis not present

## 2019-08-17 DIAGNOSIS — Z20828 Contact with and (suspected) exposure to other viral communicable diseases: Secondary | ICD-10-CM | POA: Diagnosis not present

## 2019-09-10 DIAGNOSIS — I10 Essential (primary) hypertension: Secondary | ICD-10-CM | POA: Diagnosis not present

## 2019-10-08 DIAGNOSIS — I10 Essential (primary) hypertension: Secondary | ICD-10-CM | POA: Diagnosis not present

## 2019-10-22 DIAGNOSIS — I10 Essential (primary) hypertension: Secondary | ICD-10-CM | POA: Diagnosis not present

## 2019-10-30 DIAGNOSIS — H16223 Keratoconjunctivitis sicca, not specified as Sjogren's, bilateral: Secondary | ICD-10-CM | POA: Diagnosis not present

## 2019-10-30 DIAGNOSIS — H40003 Preglaucoma, unspecified, bilateral: Secondary | ICD-10-CM | POA: Diagnosis not present

## 2019-11-08 DIAGNOSIS — E7849 Other hyperlipidemia: Secondary | ICD-10-CM | POA: Diagnosis not present

## 2019-11-08 DIAGNOSIS — I129 Hypertensive chronic kidney disease with stage 1 through stage 4 chronic kidney disease, or unspecified chronic kidney disease: Secondary | ICD-10-CM | POA: Diagnosis not present

## 2019-11-08 DIAGNOSIS — I6529 Occlusion and stenosis of unspecified carotid artery: Secondary | ICD-10-CM | POA: Diagnosis not present

## 2019-11-18 ENCOUNTER — Ambulatory Visit: Payer: Medicare Other | Attending: Internal Medicine

## 2019-11-18 DIAGNOSIS — Z23 Encounter for immunization: Secondary | ICD-10-CM | POA: Insufficient documentation

## 2019-11-18 NOTE — Progress Notes (Signed)
   Covid-19 Vaccination Clinic  Name:  [name]    MRN: [id number] DOB: 10-26-1940  11/18/2019  [name] was observed post Covid-19 immunization for 15 minutes without incidence. She was provided with Vaccine Information Sheet and instruction to access the V-Safe system.   [name] was instructed to call 911 with any severe reactions post vaccine: Marland Kitchen Difficulty breathing  . Swelling of your face and throat  . A fast heartbeat  . A bad rash all over your body  . Dizziness and weakness    Immunizations Administered    Name Date Dose VIS Date Route   Moderna COVID-19 Vaccine 11/18/2019 10:56 AM 0.5 mL 09/10/2019 Intramuscular   Manufacturer: Moderna   Lot: [id number]   [id number]

## 2019-12-18 ENCOUNTER — Other Ambulatory Visit: Payer: Self-pay

## 2019-12-18 ENCOUNTER — Ambulatory Visit: Payer: Medicare Other | Attending: Internal Medicine

## 2019-12-18 DIAGNOSIS — Z23 Encounter for immunization: Secondary | ICD-10-CM | POA: Insufficient documentation

## 2019-12-18 NOTE — Progress Notes (Signed)
   Covid-19 Vaccination Clinic  Name:  [name]    MRN: [id number] DOB: [birth year]  12/18/2019  [name] was observed post Covid-19 immunization for 15 minutes without incident. She was provided with Vaccine Information Sheet and instruction to access the V-Safe system.   [name] was instructed to call 911 with any severe reactions post vaccine: Marland Kitchen Difficulty breathing  . Swelling of face and throat  . A fast heartbeat  . A bad rash all over body  . Dizziness and weakness   Immunizations Administered    Name Date Dose VIS Date Route   Moderna COVID-19 Vaccine 12/18/2019 10:45 AM 0.5 mL 09/10/2019 Intramuscular   Manufacturer: Moderna   Lot: [id number]   [id number]

## 2020-01-15 ENCOUNTER — Ambulatory Visit
Admission: EM | Admit: 2020-01-15 | Discharge: 2020-01-15 | Disposition: A | Discharge status: Home / Self Care | Payer: Medicare Other

## 2020-01-15 DIAGNOSIS — H6121 Impacted cerumen, right ear: Secondary | ICD-10-CM

## 2020-01-15 DIAGNOSIS — H9201 Otalgia, right ear: Secondary | ICD-10-CM

## 2020-01-15 MED ORDER — FLUTICASONE PROPIONATE 50 MCG/ACT NA SUSP: 2.0000 | Freq: Every day | NASAL | 0 refills | Status: DC

## 2020-01-15 NOTE — ED Provider Notes (Signed)
[facility]    CSN: [id number] Arrival date & time: 01/15/20  1207      History   Chief Complaint Chief Complaint  Patient presents with  . Otalgia    HPI [name] is a 79 y.o. female.   79 year old female with history of HLD, HTN, asthma comes in for 1 week history of right ear pain that has worsened in the past few days. Woke up this morning, and felt some mucous in the nose. She put tissue paper up the nostril, and saw some blood. No current active bleeding.  Intermittent rhinorrhea, nasal congestion for the past 3 weeks. No cough, sneezing, sore throat. No dry nose/irritation. Denies fever, chills, body aches. Denies tinnitus, ear drainage. Denies swimming. Denies weakness, dizziness, syncope.      Past Medical History:  Diagnosis Date  . Anemia    "years ago"  . Arthritis   . Asthma   . Bradycardia   . Cataract    "skin Cancer"  . Chest pain    Nuclear, May, 2010, no scar or ischemia  . Dyslipidemia   . Ejection fraction    EF 60%, May, 2010, echo  . GERD (gastroesophageal reflux disease)   . Heart murmur   . Hemorrhoids   . Herpes simplex without mention of complication   . History of skin cancer   . HTN (hypertension)   . Hx of colonic polyps   . IBS (irritable bowel syndrome)   . Overweight(278.02)   . Pneumonia   . Tachycardia    Rapid heart beat, October, 2011, 21 day monitor, sinus rhythm, patient senses mild increase in heart rate, low-dose beta blockade try    Patient Active Problem List   Diagnosis Date Noted  . HTN (hypertension) 12/27/2011  . Mycosis fungoides ([place]) 12/27/2011  . Urinary incontinence 12/27/2011  . Chest pain   . Ejection fraction   . Tachycardia   . Dyslipidemia   . PALPITATIONS 07/22/2010  . DYSLIPIDEMIA 02/17/2009  . OBESITY, MORBID 02/17/2009  . BRADYCARDIA 02/17/2009  . SKIN CANCER, HX OF 02/17/2009  . HERPES SIMPLEX INFECTION, TYPE I 09/17/2008  . HEMORRHOIDS 09/17/2008  . IBS 09/17/2008    . COLONIC POLYPS, HX OF 09/17/2008    Past Surgical History:  Procedure Laterality Date  . APPENDECTOMY    . CATARACT EXTRACTION Bilateral   . OVARY SURGERY    . PARTIAL HYSTERECTOMY    . TONSILLECTOMY    . vocal cord surgery     polyp removed    OB History   No obstetric history on file.      Home Medications    Prior to Admission medications   Medication Sig Start Date End Date Taking? Authorizing Provider  aspirin EC 81 MG tablet Take 81 mg by mouth daily.   Yes [provider]  atorvastatin (LIPITOR) 20 MG tablet Take 20 mg by mouth daily.   Yes [provider]  valsartan (DIOVAN) 320 MG tablet Take 320 mg by mouth daily.   Yes [provider]  carvedilol (COREG) 6.25 MG tablet Take 6.25 mg by mouth 2 (two) times daily. 05/21/19   [provider]  fluticasone (FLONASE) 50 MCG/ACT nasal spray Place 2 sprays into both nostrils daily. 01/15/20   Tasia Catchings, Chyenne Sobczak V, PA-C  furosemide (LASIX) 40 MG tablet Take 40 mg by mouth daily. 03/18/19   [provider]  meloxicam (MOBIC) 15 MG tablet TAKE 1 TABLET BY MOUTH EVERY DAY 06/27/19  Gardiner Barefoot, DPM  polyethylene glycol powder (GLYCOLAX/MIRALAX) powder Take 17 g by mouth as needed.    [provider]  triamcinolone cream (KENALOG) 0.1 % triamcinolone acetonide 0.1 % topical cream    [provider]  UNABLE TO FIND triamcinolone-dimeth-silicone    [provider]  verapamil (CALAN-SR) 240 MG CR tablet Take 240 mg by mouth daily. 11/15/17   [provider]    Family History Family History  Problem Relation Age of Onset  . Stroke Mother   . Hypertension Mother   . Diabetes Father   . Hypertension Father   . Ovarian cancer Maternal Grandmother   . Breast cancer Cousin        x 4    Social History Social History   Tobacco Use  . Smoking status: Former Smoker    Packs/day: 1.00    Years: 30.00    Pack years: 30.00    Quit date: 10/10/1985    Years  since quitting: 34.2  . Smokeless tobacco: Never Used  Substance Use Topics  . Alcohol use: No  . Drug use: No     Allergies   Methylphenidate derivatives   Review of Systems Review of Systems  Reason unable to perform ROS: See HPI as above.     Physical Exam Triage Vital Signs ED Triage Vitals  Enc Vitals Group     BP 01/15/20 1214 (!) 142/78     Pulse Rate 01/15/20 1214 86     Resp 01/15/20 1214 16     Temp 01/15/20 1214 98.3 F (36.8 C)     Temp Source 01/15/20 1214 Oral     SpO2 01/15/20 1214 95 %     Weight --      Height --      Head Circumference --      Peak Flow --      Pain Score 01/15/20 1215 0     Pain Loc --      Pain Edu? --      Excl. in [place]? --    No data found.  Updated Vital Signs BP (!) 142/78 (BP Location: Left Arm)   Pulse 86   Temp 98.3 F (36.8 C) (Oral)   Resp 16   SpO2 95%   Physical Exam Constitutional:      General: She is not in acute distress.    Appearance: Normal appearance. She is well-developed. She is not toxic-appearing or diaphoretic.  HENT:     Head: Normocephalic and atraumatic.     Left Ear: Ear canal and external ear normal.     Ears:     Comments: Bilateral tragus without tenderness. Left TM opaque, no erythema, bulging.  Right ear with cerumen impaction, TM not visible.   Post ear irrigation. TM clear, no erythema/bulging.    Nose: Nose normal. No mucosal edema, congestion or rhinorrhea.     Right Nostril: No epistaxis, septal hematoma or occlusion.     Left Nostril: No epistaxis, septal hematoma or occlusion.     Right Turbinates: Not enlarged or swollen.     Left Turbinates: Not enlarged or swollen.     Right Sinus: No maxillary sinus tenderness or frontal sinus tenderness.     Left Sinus: No maxillary sinus tenderness or frontal sinus tenderness.     Mouth/Throat:     Mouth: Mucous membranes are moist.     Pharynx: Oropharynx is clear. Uvula midline.  Eyes:     Extraocular Movements: Extraocular  movements  intact.     Conjunctiva/sclera: Conjunctivae normal.     Pupils: Pupils are equal, round, and reactive to light.  Pulmonary:     Effort: Pulmonary effort is normal. No respiratory distress.     Comments: Speaking in full sentences without difficulty Musculoskeletal:     Cervical back: Normal range of motion and neck supple.  Skin:    General: Skin is warm and dry.  Neurological:     Mental Status: She is alert and oriented to person, place, and time.      UC Treatments / Results  Labs (all labs ordered are listed, but only abnormal results are displayed) Labs Reviewed - No data to display  EKG   Radiology No results found.  Procedures Procedures (including critical care time)  Medications Ordered in UC Medications - No data to display  Initial Impression / Assessment and Plan / UC Course  I have reviewed the triage vital signs and the nursing notes.  Pertinent labs & imaging results that were available during my care of the patient were reviewed by me and considered in my medical decision making (see chart for details).    Patient tolerated ear irrigation well. TM visible without otitis media. Symptoms improved. Discussed using flonase for the next 1-2 weeks for eustachian tube dysfunction. Return precautions given.   Final Clinical Impressions(s) / UC Diagnoses   Final diagnoses:  Impacted cerumen of right ear  Discomfort of right ear    ED Prescriptions    Medication Sig Dispense Auth. Provider   fluticasone (FLONASE) 50 MCG/ACT nasal spray Place 2 sprays into both nostrils daily. 1 g Ok Edwards, PA-C     PDMP not reviewed this encounter.   Ok Edwards, PA-C 01/15/20 1314

## 2020-01-15 NOTE — ED Triage Notes (Signed)
Pt c/o rt ear discomfort for over a week and has got worse in past few days. States hears a echo in rt ear. Pt states also had a few drops of blood from her nose this morning.

## 2020-01-15 NOTE — Discharge Instructions (Signed)
Ear wax removed today. Start flonase for possible eustachian tube dysfunction causing symptoms. Follow up with ENT/PCP if symptoms not improving.

## 2020-02-07 DIAGNOSIS — G4700 Insomnia, unspecified: Secondary | ICD-10-CM | POA: Diagnosis not present

## 2020-02-07 DIAGNOSIS — G479 Sleep disorder, unspecified: Secondary | ICD-10-CM | POA: Diagnosis not present

## 2020-02-07 DIAGNOSIS — R0609 Other forms of dyspnea: Secondary | ICD-10-CM | POA: Diagnosis not present

## 2020-02-07 DIAGNOSIS — R002 Palpitations: Secondary | ICD-10-CM | POA: Diagnosis not present

## 2020-02-11 ENCOUNTER — Other Ambulatory Visit (HOSPITAL_COMMUNITY): Payer: Self-pay | Admitting: Internal Medicine

## 2020-02-11 DIAGNOSIS — R0609 Other forms of dyspnea: Secondary | ICD-10-CM

## 2020-02-20 ENCOUNTER — Other Ambulatory Visit: Payer: Self-pay | Admitting: *Deleted

## 2020-02-20 DIAGNOSIS — R002 Palpitations: Secondary | ICD-10-CM

## 2020-02-21 ENCOUNTER — Telehealth: Payer: Self-pay | Admitting: Radiology

## 2020-02-21 NOTE — Telephone Encounter (Signed)
Enrolled patient for a 7 day Zio monitor to be mailed to patients home.  

## 2020-02-26 ENCOUNTER — Other Ambulatory Visit (INDEPENDENT_AMBULATORY_CARE_PROVIDER_SITE_OTHER): Payer: Medicare Other

## 2020-02-26 DIAGNOSIS — R002 Palpitations: Secondary | ICD-10-CM

## 2020-03-03 ENCOUNTER — Other Ambulatory Visit: Payer: Self-pay

## 2020-03-03 ENCOUNTER — Ambulatory Visit (HOSPITAL_COMMUNITY): Payer: Medicare Other | Attending: Cardiology

## 2020-03-03 DIAGNOSIS — R0609 Other forms of dyspnea: Secondary | ICD-10-CM | POA: Insufficient documentation

## 2020-03-23 ENCOUNTER — Other Ambulatory Visit: Payer: Self-pay

## 2020-03-23 ENCOUNTER — Ambulatory Visit (INDEPENDENT_AMBULATORY_CARE_PROVIDER_SITE_OTHER): Payer: Medicare Other | Admitting: Pulmonary Disease

## 2020-03-23 ENCOUNTER — Encounter: Payer: Self-pay | Admitting: Pulmonary Disease

## 2020-03-23 DIAGNOSIS — G4720 Circadian rhythm sleep disorder, unspecified type: Secondary | ICD-10-CM

## 2020-03-23 DIAGNOSIS — G4733 Obstructive sleep apnea (adult) (pediatric): Secondary | ICD-10-CM | POA: Diagnosis not present

## 2020-03-23 NOTE — Assessment & Plan Note (Signed)
Sleep cycle is very irregular with no specific bedtime and total sleep time seems to be stress between 7 AM to 9 AM causing frequent awakenings and long stretches of awake time I have asked her to try to restrict sleep times between 10 PM to 7 AM and see if she can get better stretches of sleep and lesser awakenings This seems to be partly related to lifestyle 1 partly because she is now lonely and retired

## 2020-03-23 NOTE — Assessment & Plan Note (Signed)
Given excessive daytime somnolence, narrow pharyngeal exam, witnessed apneas , snoring, and difficult to control hypertension obstructive sleep apnea is possible & an overnight polysomnogram will be scheduled as a home study. The pathophysiology of obstructive sleep apnea , it's cardiovascular consequences & modes of treatment including CPAP were discused with the patient in detail & they evidenced understanding.  Pretest probability is moderate

## 2020-03-23 NOTE — Patient Instructions (Signed)
Schedule home sleep test. Try to restrict your sleep times to between 10 PM and 7 AM

## 2020-03-23 NOTE — Progress Notes (Signed)
Subjective:    Patient ID: [name], female    DOB: [birth year], 79 y.o.   MRN: [id number]  HPI  Chief Complaint  Patient presents with   Consult    Frequently (2-3 times) waking up at night. 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 Her main complaint is that she has frequent awakenings through the night.  She has difficulty staying asleep and reports nonrefreshing sleep.  She has occasionally woken herself up around snoring.  She lives alone, her mother died in 2018-01-15 and she is divorced, she retired in 01-16-03.  Epworth sleepiness score is 7 and she reports sleepiness while watching TV and lying down to rest in the afternoon.  She admits that she likes to watch TV and there are 2 TVs in the family room and in the bedroom. Bedtime is between 11 PM and midnight but she is often falling asleep by 9 PM on the couch and sometimes as early as 7 PM.  She frequently wakes up around midnight and will go to her bedroom and sometimes will just sleep in the couch overnight.  She always awakens around 3 AM and then has long periods of awakening.  She will sometimes continue watching TV or start writing stuff and catching up with her daytime work.  She also reports repeated awakenings due to urine leakage. She is out of bed between 7 and 9 AM feeling tired with occasional dryness of mouth but denies headaches.  She has gained about 60 pounds over the last 10 years  There is no history suggestive of cataplexy, sleep paralysis or parasomnias  She has hypertension on 3 medications  Past Medical History:  Diagnosis Date   Anemia    "years ago"   Arthritis    Asthma    Bradycardia    Cataract    "skin Cancer"   Chest pain    Nuclear, May, 2010, no scar or ischemia   Dyslipidemia    Ejection fraction    EF 60%, May, 2010, echo   GERD (gastroesophageal reflux disease)    Heart murmur    Hemorrhoids    Herpes  simplex without mention of complication    History of skin cancer    HTN (hypertension)    Hx of colonic polyps    IBS (irritable bowel syndrome)    Overweight(278.02)    Pneumonia    Tachycardia    Rapid heart beat, October, 2011, 21 day monitor, sinus rhythm, patient senses mild increase in heart rate, low-dose beta blockade try    Past Surgical History:  Procedure Laterality Date   APPENDECTOMY     CATARACT EXTRACTION Bilateral    OVARY SURGERY     PARTIAL HYSTERECTOMY     TONSILLECTOMY     vocal cord surgery     polyp removed    Allergies  Allergen Reactions   Methylphenidate Derivatives     Makes breast enlarged    Social History   Socioeconomic History   Marital status: Divorced    Spouse name: Not on file   Number of children: 0   Years of education: Not on file   Highest education level: Not on file  Occupational History   Occupation: retired  Tobacco Use   Smoking status: Former Smoker    Packs/day: 1.00    Years: 30.00    Pack years: 30.00    Quit date: 10/10/1985    Years since quitting: 34.4  Smokeless tobacco: Never Used  Substance and Sexual Activity   Alcohol use: No   Drug use: No   Sexual activity: Not on file  Other Topics Concern   Not on file  Social History Narrative   Not on file   Social Determinants of Health   Financial Resource Strain:    Difficulty of Paying Living Expenses:   Food Insecurity:    Worried About Charity fundraiser in the Last Year:    Arboriculturist in the Last Year:   Transportation Needs:    Film/video editor (Medical):    Lack of Transportation (Non-Medical):   Physical Activity:    Days of Exercise per Week:    Minutes of Exercise per Session:   Stress:    Feeling of Stress :   Social Connections:    Frequency of Communication with Friends and Family:    Frequency of Social Gatherings with Friends and Family:    Attends Religious Services:    Active  Member of Clubs or Organizations:    Attends Music therapist:    Marital Status:   Intimate Partner Violence:    Fear of Current or Ex-Partner:    Emotionally Abused:    Physically Abused:    Sexually Abused:    Family History  Problem Relation Age of Onset   Stroke Mother    Hypertension Mother    Diabetes Father    Hypertension Father    Ovarian cancer Maternal Grandmother    Breast cancer Cousin        x 4      Review of Systems   Constitutional: negative for anorexia, fevers and sweats  Eyes: negative for irritation, redness and visual disturbance  Ears, nose, mouth, throat, and face: negative for earaches, epistaxis, nasal congestion and sore throat  Respiratory: negative for cough, dyspnea on exertion, sputum and wheezing  Cardiovascular: negative for chest pain, dyspnea, , orthopnea, palpitations and syncope  Gastrointestinal: negative for abdominal pain, constipation, diarrhea, melena, nausea and vomiting  Genitourinary:negative for dysuria, frequency and hematuria  Hematologic/lymphatic: negative for bleeding, easy bruising and lymphadenopathy  Musculoskeletal:negative for arthralgias, muscle weakness and stiff joints  Neurological: negative for coordination problems, gait problems, headaches and weakness  Endocrine: negative for diabetic symptoms including polydipsia, polyuria and weight loss Positive for urine leakage during the night     Objective:   Physical Exam  Gen. Pleasant, obese, in no distress, normal affect ENT - no pallor,icterus, no post nasal drip, class 2-3 airway Neck: No JVD, no thyromegaly, no carotid bruits Lungs: no use of accessory muscles, no dullness to percussion, decreased without rales or rhonchi  Cardiovascular: Rhythm regular, heart sounds  normal, no murmurs or gallops, 1+ peripheral edema Abdomen: soft and non-tender, no hepatosplenomegaly, BS normal. Musculoskeletal: No deformities, no cyanosis or  clubbing Neuro:  alert, non focal, no tremors       Assessment & Plan:

## 2020-03-24 NOTE — Telephone Encounter (Signed)
[name] from [facility] is calling requesting the patient's monitor results. Please advise.

## 2020-03-24 NOTE — Telephone Encounter (Signed)
LMVM-[name].  ZIO patch monitor is being processed by [employer].  We do not have results available to import for review yet.  I have contact our [employer] representative to please expedite processing.  We should have results to be reviewed by cardiologist within 24 hours.

## 2020-03-25 DIAGNOSIS — R002 Palpitations: Secondary | ICD-10-CM | POA: Diagnosis not present

## 2020-03-25 NOTE — Progress Notes (Deleted)
Cardiology Office Note:   Date:  03/25/2020  NAME:  [name]    MRN: [id number] DOB:  03/31/1941   PCP:  Michael Boston, MD  Cardiologist:  No primary care provider on file.  Electrophysiologist:  None   Referring MD: Michael Boston, MD   No chief complaint on file. ***  History of Present Illness:   [name] is a 79 y.o. female with a hx of HTN who is being seen today for the evaluation of palpitations at the request of Michael Boston, MD. Normal echo. Zio pending.   Past Medical History: Past Medical History:  Diagnosis Date  . Anemia    "years ago"  . Arthritis   . Asthma   . Bradycardia   . Cataract    "skin Cancer"  . Chest pain    Nuclear, May, 2010, no scar or ischemia  . Dyslipidemia   . Ejection fraction    EF 60%, May, 2010, echo  . GERD (gastroesophageal reflux disease)   . Heart murmur   . Hemorrhoids   . Herpes simplex without mention of complication   . History of skin cancer   . HTN (hypertension)   . Hx of colonic polyps   . IBS (irritable bowel syndrome)   . Overweight(278.02)   . Pneumonia   . Tachycardia    Rapid heart beat, October, 2011, 21 day monitor, sinus rhythm, patient senses mild increase in heart rate, low-dose beta blockade try    Past Surgical History: Past Surgical History:  Procedure Laterality Date  . APPENDECTOMY    . CATARACT EXTRACTION Bilateral   . OVARY SURGERY    . PARTIAL HYSTERECTOMY    . TONSILLECTOMY    . vocal cord surgery     polyp removed    Current Medications: No outpatient medications have been marked as taking for the 03/27/20 encounter (Appointment) with O'Neal, Cassie Freer, MD.     Allergies:    Methylphenidate derivatives   Social History: Social History   Socioeconomic History  . Marital status: Divorced    Spouse name: Not on file  . Number of children: 0  . Years of education: Not on file  . Highest education level: Not on file  Occupational History  . Occupation:  retired  Tobacco Use  . Smoking status: Former Smoker    Packs/day: 1.00    Years: 30.00    Pack years: 30.00    Quit date: 10/10/1985    Years since quitting: 34.4  . Smokeless tobacco: Never Used  Substance and Sexual Activity  . Alcohol use: No  . Drug use: No  . Sexual activity: Not on file  Other Topics Concern  . Not on file  Social History Narrative  . Not on file   Social Determinants of Health   Financial Resource Strain:   . Difficulty of Paying Living Expenses:   Food Insecurity:   . Worried About Charity fundraiser in the Last Year:   . Arboriculturist in the Last Year:   Transportation Needs:   . Film/video editor (Medical):   Marland Kitchen Lack of Transportation (Non-Medical):   Physical Activity:   . Days of Exercise per Week:   . Minutes of Exercise per Session:   Stress:   . Feeling of Stress :   Social Connections:   . Frequency of Communication with Friends and Family:   . Frequency of Social Gatherings with Friends and Family:   .  Attends Religious Services:   . Active Member of Clubs or Organizations:   . Attends Archivist Meetings:   Marland Kitchen Marital Status:      Family History: The patient's ***family history includes Breast cancer in her cousin; Diabetes in her father; Hypertension in her father and mother; Ovarian cancer in her maternal grandmother; Stroke in her mother.  ROS:   All other ROS reviewed and negative. Pertinent positives noted in the HPI.     EKGs/Labs/Other Studies Reviewed:   The following studies were personally reviewed by me today:  EKG:  EKG is *** ordered today.  The ekg ordered today demonstrates ***, and was personally reviewed by me.  TTE 03/03/2020 1. Normal LV systolic function; grade 1 diastolic dysfunction; mild LVH:  mild AI.  2. Left ventricular ejection fraction, by estimation, is 60 to 65%. The  left ventricle has normal function. The left ventricle has no regional  wall motion abnormalities. There is mild  left ventricular hypertrophy.  Left ventricular diastolic parameters  are consistent with Grade I diastolic dysfunction (impaired relaxation).  3. Right ventricular systolic function is normal. The right ventricular  size is normal. There is normal pulmonary artery systolic pressure.  4. The mitral valve is normal in structure. Trivial mitral valve  regurgitation. No evidence of mitral stenosis.  5. The aortic valve is tricuspid. Aortic valve regurgitation is mild.  Mild aortic valve sclerosis is present, with no evidence of aortic valve  stenosis.  6. The inferior vena cava is normal in size with greater than 50%  respiratory variability, suggesting right atrial pressure of 3 mmHg.    Recent Labs: 04/25/2019: BUN 14; Creatinine, Ser 1.25; Hemoglobin 12.3; Platelets 190; Potassium 4.2; Sodium 138   Recent Lipid Panel    Component Value Date/Time   CHOL  02/13/2009 0428    195        ATP III CLASSIFICATION:  <200     mg/dL   Desirable  200-239  mg/dL   Borderline High  >=240    mg/dL   High          TRIG 97 02/13/2009 0428   HDL 33 (L) 02/13/2009 0428   CHOLHDL 5.9 02/13/2009 0428   VLDL 19 02/13/2009 0428   LDLCALC (H) 02/13/2009 0428    143        Total Cholesterol/HDL:CHD Risk Coronary Heart Disease Risk Table                     Men   Women  1/2 Average Risk   3.4   3.3  Average Risk       5.0   4.4  2 X Average Risk   9.6   7.1  3 X Average Risk  23.4   11.0        Use the calculated Patient Ratio above and the CHD Risk Table to determine the patient's CHD Risk.        ATP III CLASSIFICATION (LDL):  <100     mg/dL   Optimal  100-129  mg/dL   Near or Above                    Optimal  130-159  mg/dL   Borderline  160-189  mg/dL   High  >190     mg/dL   Very High    Physical Exam:   VS:  There were no vitals taken for this visit.   Wt Readings from  Last 3 Encounters:  03/23/20 233 lb 3.2 oz (105.8 kg)  10/17/18 244 lb (110.7 kg)  08/31/18 244 lb (110.7  kg)    General: Well nourished, well developed, in no acute distress Heart: Atraumatic, normal size  Eyes: PEERLA, EOMI  Neck: Supple, no JVD Endocrine: No thryomegaly Cardiac: Normal S1, S2; RRR; no murmurs, rubs, or gallops Lungs: Clear to auscultation bilaterally, no wheezing, rhonchi or rales  Abd: Soft, nontender, no hepatomegaly  Ext: No edema, pulses 2+ Musculoskeletal: No deformities, BUE and BLE strength normal and equal Skin: Warm and dry, no rashes   Neuro: Alert and oriented to person, place, time, and situation, CNII-XII grossly intact, no focal deficits  Psych: Normal mood and affect   ASSESSMENT:   [name] is a 79 y.o. female who presents for the following: No diagnosis found.  PLAN:   There are no diagnoses linked to this encounter.  Disposition: No follow-ups on file.  Medication Adjustments/Labs and Tests Ordered: Current medicines are reviewed at length with the patient today.  Concerns regarding medicines are outlined above.  No orders of the defined types were placed in this encounter.  No orders of the defined types were placed in this encounter.   There are no Patient Instructions on file for this visit.   Time Spent with Patient: I have spent a total of *** minutes with patient reviewing hospital notes, telemetry, EKGs, labs and examining the patient as well as establishing an assessment and plan that was discussed with the patient.  > 50% of time was spent in direct patient care.  Signed, Addison Naegeli. Audie Box, [place]  [address] [place], [place] [address] [phone]  03/25/2020 7:52 AM

## 2020-03-26 NOTE — Telephone Encounter (Signed)
Returned call to [place]. Monitor was imported this am and is waiting on results from the dr.

## 2020-03-26 NOTE — Telephone Encounter (Signed)
[name] with [facility] is calling to follow up in regards to patient's monitor results. Please return call to discuss.

## 2020-03-27 ENCOUNTER — Ambulatory Visit: Payer: Medicare Other | Admitting: Cardiovascular Disease

## 2020-04-01 DIAGNOSIS — Z0120 Encounter for dental examination and cleaning without abnormal findings: Secondary | ICD-10-CM | POA: Diagnosis not present

## 2020-04-07 DIAGNOSIS — C8400 Mycosis fungoides, unspecified site: Secondary | ICD-10-CM | POA: Diagnosis not present

## 2020-04-07 DIAGNOSIS — D1722 Benign lipomatous neoplasm of skin and subcutaneous tissue of left arm: Secondary | ICD-10-CM | POA: Diagnosis not present

## 2020-04-07 DIAGNOSIS — D1721 Benign lipomatous neoplasm of skin and subcutaneous tissue of right arm: Secondary | ICD-10-CM | POA: Diagnosis not present

## 2020-04-07 DIAGNOSIS — L304 Erythema intertrigo: Secondary | ICD-10-CM | POA: Diagnosis not present

## 2020-04-21 ENCOUNTER — Encounter: Payer: Self-pay | Admitting: Pulmonary Disease

## 2020-05-01 ENCOUNTER — Ambulatory Visit: Payer: Medicare Other

## 2020-05-01 ENCOUNTER — Other Ambulatory Visit: Payer: Self-pay

## 2020-05-01 DIAGNOSIS — G4733 Obstructive sleep apnea (adult) (pediatric): Secondary | ICD-10-CM | POA: Diagnosis not present

## 2020-05-01 DIAGNOSIS — G4720 Circadian rhythm sleep disorder, unspecified type: Secondary | ICD-10-CM

## 2020-05-11 ENCOUNTER — Telehealth: Payer: Self-pay | Admitting: Pulmonary Disease

## 2020-05-11 DIAGNOSIS — G4733 Obstructive sleep apnea (adult) (pediatric): Secondary | ICD-10-CM | POA: Diagnosis not present

## 2020-05-11 NOTE — Telephone Encounter (Signed)
HST showed severe OSA >> AHI 32/h Suggest autoCPAP 5-15 cm, mask of choice OV with me / APP in 6 wks

## 2020-05-12 ENCOUNTER — Telehealth: Payer: Self-pay

## 2020-05-12 ENCOUNTER — Other Ambulatory Visit: Payer: Self-pay | Admitting: Pulmonary Disease

## 2020-05-12 DIAGNOSIS — G4733 Obstructive sleep apnea (adult) (pediatric): Secondary | ICD-10-CM

## 2020-05-12 NOTE — Telephone Encounter (Signed)
I called pt to give her cpap appt info.  She said someone had been trying to call her.  I see 3 open messages on pt.  She said home phone is out of order - call cell # [phone].  I have given her info so I don't need to talk to her again.

## 2020-05-12 NOTE — Telephone Encounter (Signed)
Spoke with pt about sleep titration study. Pt states understanding. Nothing further needed at this time.

## 2020-05-12 NOTE — Progress Notes (Unsigned)
Pt has agreed to cpap titration study done order has been placed in chart//sb

## 2020-05-21 ENCOUNTER — Other Ambulatory Visit: Payer: Self-pay | Admitting: Pulmonary Disease

## 2020-05-21 DIAGNOSIS — G4733 Obstructive sleep apnea (adult) (pediatric): Secondary | ICD-10-CM

## 2020-05-21 NOTE — Telephone Encounter (Signed)
Pt was called order placed in chart for cpap supplies and cpap titration was cancelled

## 2020-05-25 DIAGNOSIS — R0789 Other chest pain: Secondary | ICD-10-CM | POA: Diagnosis not present

## 2020-05-25 DIAGNOSIS — G4733 Obstructive sleep apnea (adult) (pediatric): Secondary | ICD-10-CM | POA: Diagnosis not present

## 2020-05-25 DIAGNOSIS — K219 Gastro-esophageal reflux disease without esophagitis: Secondary | ICD-10-CM | POA: Diagnosis not present

## 2020-05-25 DIAGNOSIS — R05 Cough: Secondary | ICD-10-CM | POA: Diagnosis not present

## 2020-06-09 NOTE — Telephone Encounter (Signed)
Called patient let her know she did not need the sleep study AND scheduled her for her 6 week follow up . She is getting set up with her CPAP on 06/18/20, follow up in 07/27/20 with Wyn Quaker , FNP.  Nothing further needed at this time.

## 2020-06-09 NOTE — Telephone Encounter (Signed)
Called sleep center, [name] is on phone will call back  Called spoke with patient she does not know about whether she should not should not have the cpap titration test done. States she has gotten about 7 calls regarding this. She is supposed to go pick up her CPAP on 06/17/20 at [facility]   Called Dr. Elsworth Soho to verify. Patient does not need CPAP titration at this time. While on phone with Dr. Elsworth Soho , [name] called back from sleep center, I shared his recommendations from Dr. Elsworth Soho that patient did not need study.   Called patient

## 2020-06-09 NOTE — Telephone Encounter (Signed)
Sleep lab calling w/question concern pt cpap tittation they were unclear as well as pt on if this was cancelled you can reach sleeplab @ [phone] they were also wanting [place] to reach out to pt.Hillery Hunter

## 2020-06-11 DIAGNOSIS — Z1231 Encounter for screening mammogram for malignant neoplasm of breast: Secondary | ICD-10-CM | POA: Diagnosis not present

## 2020-06-12 ENCOUNTER — Encounter (HOSPITAL_BASED_OUTPATIENT_CLINIC_OR_DEPARTMENT_OTHER): Payer: Medicare Other | Admitting: Internal Medicine

## 2020-06-18 DIAGNOSIS — G4733 Obstructive sleep apnea (adult) (pediatric): Secondary | ICD-10-CM | POA: Diagnosis not present

## 2020-07-01 DIAGNOSIS — N3946 Mixed incontinence: Secondary | ICD-10-CM | POA: Diagnosis not present

## 2020-07-01 DIAGNOSIS — R351 Nocturia: Secondary | ICD-10-CM | POA: Diagnosis not present

## 2020-07-21 DIAGNOSIS — G4733 Obstructive sleep apnea (adult) (pediatric): Secondary | ICD-10-CM | POA: Diagnosis not present

## 2020-07-30 ENCOUNTER — Other Ambulatory Visit: Payer: Self-pay

## 2020-07-30 ENCOUNTER — Ambulatory Visit (INDEPENDENT_AMBULATORY_CARE_PROVIDER_SITE_OTHER): Payer: Medicare Other | Admitting: Pulmonary Disease

## 2020-07-30 ENCOUNTER — Encounter: Payer: Self-pay | Admitting: Pulmonary Disease

## 2020-07-30 VITALS — BP 132/72 | HR 76 | Temp 98.0°F | Ht 62.5 in | Wt 235.0 lb

## 2020-07-30 DIAGNOSIS — G4733 Obstructive sleep apnea (adult) (pediatric): Secondary | ICD-10-CM

## 2020-07-30 DIAGNOSIS — G4720 Circadian rhythm sleep disorder, unspecified type: Secondary | ICD-10-CM

## 2020-07-30 DIAGNOSIS — Z0000 Encounter for general adult medical examination without abnormal findings: Secondary | ICD-10-CM | POA: Insufficient documentation

## 2020-07-30 NOTE — Assessment & Plan Note (Signed)
Reviewed home sleep study results with patient Discussed pathophysiology of obstructive sleep apnea Reviewed CPAP compliance report  Plan: Continue CPAP therapy Follow-up in 1 year

## 2020-07-30 NOTE — Patient Instructions (Addendum)
You were seen today by Annette Rinne, NP  for:   1. OSA (obstructive sleep apnea)  We recommend that you continue using your CPAP daily >>>Keep up the hard work using your device >>> Goal should be wearing this for the entire night that you are sleeping, at least 4 to 6 hours  Remember:  . Do not drive or operate heavy machinery if tired or drowsy.  . Please notify the supply company and office if you are unable to use your device regularly due to missing supplies or machine being broken.  . Work on maintaining a healthy weight and following your recommended nutrition plan  . Maintain proper daily exercise and movement  . Maintaining proper use of your device can also help improve management of other chronic illnesses such as: Blood pressure, blood sugars, and weight management.   BiPAP/ CPAP Cleaning:  >>>Clean weekly, with Dawn soap, and bottle brush.  Set up to air dry. >>> Wipe mask out daily with wet wipe or towelette   2. Sleep-wake cycle disorder  Work on improving sleep hygiene:   Sleep habits   Keep a sleep diary to help you and your health care provider figure out what could be causing your insomnia. Write down: ? When you sleep. ? When you wake up during the night. ? How well you sleep. ? How rested you feel the next day. ? Any side effects of medicines you are taking. ? What you eat and drink.  Make your bedroom a dark, comfortable place where it is easy to fall asleep. ? Put up shades or blackout curtains to block light from outside. ? Use a white noise machine to block noise. ? Keep the temperature cool.  Limit screen use before bedtime. This includes: ? Watching TV. ? Using your smartphone, tablet, or computer.  Stick to a routine that includes going to bed and waking up at the same times every day and night. This can help you fall asleep faster. Consider making a quiet activity, such as reading, part of your nighttime routine.  Try to avoid taking naps  during the day so that you sleep better at night.  Get out of bed if you are still awake after 15 minutes of trying to sleep. Keep the lights down, but try reading or doing a quiet activity. When you feel sleepy, go back to bed.   Follow Up:    Return in about 1 year (around 07/30/2021), or if symptoms worsen or fail to improve, for Follow up with Dr. Elsworth Soho.   Notification of test results are managed in the following manner: If there are  any recommendations or changes to the  plan of care discussed in office today,  we will contact you and let you know what they are. If you do not hear from [place], then your results are normal and you can view them through your  MyChart account , or a letter will be sent to you. Thank you again for trusting [place] with your care  - Thank you, [facility]    It is flu season:   >>> Best ways to protect herself from the flu: Receive the yearly flu vaccine, practice good hand hygiene washing with soap and also using hand sanitizer when available, eat a nutritious meals, get adequate rest, hydrate appropriately       Please contact the office if your symptoms worsen or you have concerns that you are not improving.   Thank you for choosing  [facility] for your healthcare, and for allowing [place] to partner with you on your healthcare journey. I am thankful to be able to provide care to you today.   Wyn Quaker FNP-C    Sleep Apnea Sleep apnea affects breathing during sleep. It causes breathing to stop for a short time or to become shallow. It can also increase the risk of:  Heart attack.  Stroke.  Being very overweight (obese).  Diabetes.  Heart failure.  Irregular heartbeat. The goal of treatment is to help you breathe normally again. What are the causes? There are three kinds of sleep apnea:  Obstructive sleep apnea. This is caused by a blocked or collapsed airway.  Central sleep apnea. This happens when the brain does not send the  right signals to the muscles that control breathing.  Mixed sleep apnea. This is a combination of obstructive and central sleep apnea. The most common cause of this condition is a collapsed or blocked airway. This can happen if:  Your throat muscles are too relaxed.  Your tongue and tonsils are too large.  You are overweight.  Your airway is too small. What increases the risk?  Being overweight.  Smoking.  Having a small airway.  Being older.  Being female.  Drinking alcohol.  Taking medicines to calm yourself (sedatives or tranquilizers).  Having family members with the condition. What are the signs or symptoms?  Trouble staying asleep.  Being sleepy or tired during the day.  Getting angry a lot.  Loud snoring.  Headaches in the morning.  Not being able to focus your mind (concentrate).  Forgetting things.  Less interest in sex.  Mood swings.  Personality changes.  Feelings of sadness (depression).  Waking up a lot during the night to pee (urinate).  Dry mouth.  Sore throat. How is this diagnosed?  Your medical history.  A physical exam.  A test that is done when you are sleeping (sleep study). The test is most often done in a sleep lab but may also be done at home. How is this treated?   Sleeping on your side.  Using a medicine to get rid of mucus in your nose (decongestant).  Avoiding the use of alcohol, medicines to help you relax, or certain pain medicines (narcotics).  Losing weight, if needed.  Changing your diet.  Not smoking.  Using a machine to open your airway while you sleep, such as: ? An oral appliance. This is a mouthpiece that shifts your lower jaw forward. ? A CPAP device. This device blows air through a mask when you breathe out (exhale). ? An EPAP device. This has valves that you put in each nostril. ? A BPAP device. This device blows air through a mask when you breathe in (inhale) and breathe out.  Having surgery  if other treatments do not work. It is important to get treatment for sleep apnea. Without treatment, it can lead to:  High blood pressure.  Coronary artery disease.  In men, not being able to have an erection (impotence).  Reduced thinking ability. Follow these instructions at home: Lifestyle  Make changes that your doctor recommends.  Eat a healthy diet.  Lose weight if needed.  Avoid alcohol, medicines to help you relax, and some pain medicines.  Do not use any products that contain nicotine or tobacco, such as cigarettes, e-cigarettes, and chewing tobacco. If you need help quitting, ask your doctor. General instructions  Take over-the-counter and prescription medicines only as told by  your doctor.  If you were given a machine to use while you sleep, use it only as told by your doctor.  If you are having surgery, make sure to tell your doctor you have sleep apnea. You may need to bring your device with you.  Keep all follow-up visits as told by your doctor. This is important. Contact a doctor if:  The machine that you were given to use during sleep bothers you or does not seem to be working.  You do not get better.  You get worse. Get help right away if:  Your chest hurts.  You have trouble breathing in enough air.  You have an uncomfortable feeling in your back, arms, or stomach.  You have trouble talking.  One side of your body feels weak.  A part of your face is hanging down. These symptoms may be an emergency. Do not wait to see if the symptoms will go away. Get medical help right away. Call your local emergency services (911 in the [place] Do not drive yourself to the hospital. Summary  This condition affects breathing during sleep.  The most common cause is a collapsed or blocked airway.  The goal of treatment is to help you breathe normally while you sleep. This information is not intended to replace advice given to you by your health care provider.  Make sure you discuss any questions you have with your health care provider. Document Revised: 07/13/2018 Document Reviewed: 05/22/2018 Elsevier Patient Education  [place].

## 2020-07-30 NOTE — Assessment & Plan Note (Signed)
Patient plans obtain flu vaccine at annual physical primary care

## 2020-07-30 NOTE — Progress Notes (Signed)
@Patient  ID: [name], female    DOB: 07-Aug-1941, 79 y.o.   MRN: [id number]  Chief Complaint  Patient presents with  . Follow-up    OSA, doing well    Referring provider: Michael Boston, MD  HPI:  79 year old female former smoker followed in our office for severe obstructive sleep apnea and sleep-wake cycle disorder  PMH: Obesity, dyslipidemia, IBS Smoker/ Smoking History: Former smoker.  Quit 1987.  30-pack-year smoking history. Maintenance: None Pt of: Dr. Elsworth Soho  07/30/2020  - Visit   79 year old female former smoker followed in our office for obstructive sleep apnea.  She is followed by Dr. Elsworth Soho.  Patient with known severe obstructive sleep apnea.  Patient CPAP compliance report shows excellent compliance.  See compliance were listed below:  06/30/2020-07/29/2020-30 had a last 30 days use, all 30 those days greater than 4 hours, average usage 8 hours and 8 minutes, APAP setting 5-15, AHI 3.3  Patient reporting that she would like to go over her home sleep study results.  We will discuss this today.  She is using her CPAP without issue.  She still feels about the same.  She has not noticed any sort of improvement with daytime fatigue worse sleep wake cycle.  She continues to watch TV late at night and does not have a regular bedtime.  She reports that primary care has informed her that she needs to improve her sleep hygiene.  She has not worked aggressively on this.  She is about to start using nasal pillow mask as she feels this will be more comfortable.  She has received this in the mail and plans to start tonight.   Questionaires / Pulmonary Flowsheets:   Epworth:  Results of the Epworth flowsheet 03/23/2020  Sitting and reading 1  Watching TV 3  Sitting, inactive in a public place (e.g. a theatre or a meeting) 0  As a passenger in a car for an hour without a break 0  Lying down to rest in the afternoon when circumstances permit 2  Sitting and talking to someone 0   Sitting quietly after a lunch without alcohol 1  In a car, while stopped for a few minutes in traffic 0  Total score 7    Tests:   05/03/20 - HST showed severe OSA >> AHI 32/h  FENO:  No results found for: NITRICOXIDE  PFT: No flowsheet data found.  WALK:  No flowsheet data found.  Imaging: No results found.  Lab Results:  CBC    Component Value Date/Time   WBC 10.3 04/25/2019 1626   RBC 3.96 04/25/2019 1626   HGB 12.3 04/25/2019 1626   HCT 37.9 04/25/2019 1626   PLT 190 04/25/2019 1626   MCV 95.7 04/25/2019 1626   MCH 31.1 04/25/2019 1626   MCHC 32.5 04/25/2019 1626   RDW 12.6 04/25/2019 1626   LYMPHSABS 2.7 11/10/2015 2033   MONOABS 0.6 11/10/2015 2033   EOSABS 0.2 11/10/2015 2033   BASOSABS 0.0 11/10/2015 2033    BMET    Component Value Date/Time   NA 138 04/25/2019 1626   K 4.2 04/25/2019 1626   CL 101 04/25/2019 1626   CO2 26 04/25/2019 1626   GLUCOSE 112 (H) 04/25/2019 1626   BUN 14 04/25/2019 1626   CREATININE 1.25 (H) 04/25/2019 1626   CALCIUM 9.2 04/25/2019 1626   GFRNONAA 42 (L) 04/25/2019 1626   GFRAA 48 (L) 04/25/2019 1626    BNP No results found for: BNP  ProBNP  Component Value Date/Time   PROBNP 56.0 02/13/2009 0033    Specialty Problems      Pulmonary Problems   OSA (obstructive sleep apnea)      Allergies  Allergen Reactions  . Methylphenidate Derivatives     Makes breast enlarged    Immunization History  Administered Date(s) Administered  . Moderna SARS-COVID-2 Vaccination 11/18/2019, 12/18/2019    Past Medical History:  Diagnosis Date  . Anemia    "years ago"  . Arthritis   . Asthma   . Bradycardia   . Cataract    "skin Cancer"  . Chest pain    Nuclear, May, 2010, no scar or ischemia  . Dyslipidemia   . Ejection fraction    EF 60%, May, 2010, echo  . GERD (gastroesophageal reflux disease)   . Heart murmur   . Hemorrhoids   . Herpes simplex without mention of complication   . History of skin  cancer   . HTN (hypertension)   . Hx of colonic polyps   . IBS (irritable bowel syndrome)   . Overweight(278.02)   . Pneumonia   . Tachycardia    Rapid heart beat, October, 2011, 21 day monitor, sinus rhythm, patient senses mild increase in heart rate, low-dose beta blockade try    Tobacco History: Social History   Tobacco Use  Smoking Status Former Smoker  . Packs/day: 1.00  . Years: 30.00  . Pack years: 30.00  . Quit date: 10/10/1985  . Years since quitting: 34.8  Smokeless Tobacco Never Used   Counseling given: Yes   Continue to not smoke  Outpatient Encounter Medications as of 07/30/2020  Medication Sig  . aspirin EC 81 MG tablet Take 81 mg by mouth daily.  Marland Kitchen atorvastatin (LIPITOR) 20 MG tablet Take 20 mg by mouth daily.  . carvedilol (COREG) 6.25 MG tablet Take 6.25 mg by mouth 2 (two) times daily.   . fluticasone (FLONASE) 50 MCG/ACT nasal spray Place 2 sprays into both nostrils daily.  . furosemide (LASIX) 40 MG tablet Take 40 mg by mouth daily.  . meloxicam (MOBIC) 15 MG tablet TAKE 1 TABLET BY MOUTH EVERY DAY  . pantoprazole (PROTONIX) 40 MG tablet Take 40 mg by mouth daily.  . polyethylene glycol powder (GLYCOLAX/MIRALAX) powder Take 17 g by mouth as needed.  . triamcinolone cream (KENALOG) 0.1 % triamcinolone acetonide 0.1 % topical cream  . UNABLE TO FIND triamcinolone-dimeth-silicone  . valsartan (DIOVAN) 320 MG tablet Take 320 mg by mouth daily.  . verapamil (CALAN-SR) 240 MG CR tablet Take 240 mg by mouth daily.  . Vibegron 75 MG TABS Take 1 tablet by mouth daily.   No facility-administered encounter medications on file as of 07/30/2020.     Review of Systems  Review of Systems  Constitutional: Negative for activity change, fatigue and fever.  HENT: Negative for sinus pressure, sinus pain and sore throat.   Respiratory: Negative for cough, shortness of breath and wheezing.   Cardiovascular: Negative for chest pain and palpitations.  Gastrointestinal:  Negative for diarrhea, nausea and vomiting.  Musculoskeletal: Negative for arthralgias.  Neurological: Negative for dizziness.  Psychiatric/Behavioral: Negative for sleep disturbance. The patient is not nervous/anxious.      Physical Exam  BP 132/72 (BP Location: Left Arm, Cuff Size: Normal)   Pulse 76   Temp 98 F (36.7 C) (Oral)   Ht 5' 2.5" (1.588 m)   Wt 235 lb (106.6 kg)   SpO2 96%   BMI 42.30 kg/m   Wt  Readings from Last 5 Encounters:  07/30/20 235 lb (106.6 kg)  03/23/20 233 lb 3.2 oz (105.8 kg)  10/17/18 244 lb (110.7 kg)  08/31/18 244 lb (110.7 kg)  12/21/15 238 lb (108 kg)    BMI Readings from Last 5 Encounters:  07/30/20 42.30 kg/m  03/23/20 41.31 kg/m  10/17/18 43.92 kg/m  08/31/18 43.92 kg/m  12/21/15 42.84 kg/m     Physical Exam Vitals and nursing note reviewed.  Constitutional:      General: She is not in acute distress.    Appearance: Normal appearance. She is obese.  HENT:     Head: Normocephalic and atraumatic.     Right Ear: Tympanic membrane, ear canal and external ear normal. There is no impacted cerumen.     Left Ear: Tympanic membrane, ear canal and external ear normal. There is no impacted cerumen.     Nose: Nose normal. No congestion.     Mouth/Throat:     Mouth: Mucous membranes are moist.     Pharynx: Oropharynx is clear.  Eyes:     Pupils: Pupils are equal, round, and reactive to light.  Cardiovascular:     Rate and Rhythm: Normal rate and regular rhythm.     Pulses: Normal pulses.     Heart sounds: Normal heart sounds. No murmur heard.   Pulmonary:     Breath sounds: Normal breath sounds. No decreased air movement. No decreased breath sounds, wheezing or rales.  Musculoskeletal:     Cervical back: Normal range of motion.  Skin:    General: Skin is warm and dry.     Capillary Refill: Capillary refill takes less than 2 seconds.  Neurological:     General: No focal deficit present.     Mental Status: She is alert and  oriented to person, place, and time. Mental status is at baseline.     Gait: Gait normal.  Psychiatric:        Mood and Affect: Mood normal.        Behavior: Behavior normal.        Thought Content: Thought content normal.        Judgment: Judgment normal.       Assessment & Plan:   OSA (obstructive sleep apnea) Reviewed home sleep study results with patient Discussed pathophysiology of obstructive sleep apnea Reviewed CPAP compliance report  Plan: Continue CPAP therapy Follow-up in 1 year  Sleep-wake cycle disorder Sleep cycle continues to be very irregular Patient reports that she often stays up late at night or sleeps in for most of the morning There is no strict pattern  Plan: Have encouraged patient to set and follow a sleep wake time of 10 PM go to sleep and wake up at 7 AM. I have provided sleep hygiene information on her AVS Continue CPAP therapy Follow-up in 1 year  Healthcare maintenance Patient plans obtain flu vaccine at annual physical primary care    Return in about 1 year (around 07/30/2021), or if symptoms worsen or fail to improve, for Follow up with Dr. Elsworth Soho.   Lauraine Rinne, NP 07/30/2020   This appointment required 32 minutes of patient care (this includes precharting, chart review, review of results, face-to-face care, etc.).

## 2020-07-30 NOTE — Assessment & Plan Note (Signed)
Sleep cycle continues to be very irregular Patient reports that she often stays up late at night or sleeps in for most of the morning There is no strict pattern  Plan: Have encouraged patient to set and follow a sleep wake time of 10 PM go to sleep and wake up at 7 AM. I have provided sleep hygiene information on her AVS Continue CPAP therapy Follow-up in 1 year

## 2020-07-31 DIAGNOSIS — I1310 Hypertensive heart and chronic kidney disease without heart failure, with stage 1 through stage 4 chronic kidney disease, or unspecified chronic kidney disease: Secondary | ICD-10-CM | POA: Diagnosis not present

## 2020-07-31 DIAGNOSIS — N644 Mastodynia: Secondary | ICD-10-CM | POA: Diagnosis not present

## 2020-07-31 DIAGNOSIS — E042 Nontoxic multinodular goiter: Secondary | ICD-10-CM | POA: Diagnosis not present

## 2020-07-31 DIAGNOSIS — E785 Hyperlipidemia, unspecified: Secondary | ICD-10-CM | POA: Diagnosis not present

## 2020-07-31 DIAGNOSIS — T148XXA Other injury of unspecified body region, initial encounter: Secondary | ICD-10-CM | POA: Diagnosis not present

## 2020-08-03 DIAGNOSIS — R928 Other abnormal and inconclusive findings on diagnostic imaging of breast: Secondary | ICD-10-CM | POA: Diagnosis not present

## 2020-08-03 DIAGNOSIS — N6489 Other specified disorders of breast: Secondary | ICD-10-CM | POA: Diagnosis not present

## 2020-08-03 DIAGNOSIS — N641 Fat necrosis of breast: Secondary | ICD-10-CM | POA: Diagnosis not present

## 2020-08-11 DIAGNOSIS — Z87891 Personal history of nicotine dependence: Secondary | ICD-10-CM | POA: Diagnosis not present

## 2020-08-11 DIAGNOSIS — I1310 Hypertensive heart and chronic kidney disease without heart failure, with stage 1 through stage 4 chronic kidney disease, or unspecified chronic kidney disease: Secondary | ICD-10-CM | POA: Diagnosis not present

## 2020-08-11 DIAGNOSIS — E785 Hyperlipidemia, unspecified: Secondary | ICD-10-CM | POA: Diagnosis not present

## 2020-08-11 DIAGNOSIS — Z0000 Encounter for general adult medical examination without abnormal findings: Secondary | ICD-10-CM | POA: Diagnosis not present

## 2020-08-20 DIAGNOSIS — Z20822 Contact with and (suspected) exposure to covid-19: Secondary | ICD-10-CM | POA: Diagnosis not present

## 2020-08-26 DIAGNOSIS — R351 Nocturia: Secondary | ICD-10-CM | POA: Diagnosis not present

## 2020-08-26 DIAGNOSIS — N3946 Mixed incontinence: Secondary | ICD-10-CM | POA: Diagnosis not present

## 2020-08-28 DIAGNOSIS — Z780 Asymptomatic menopausal state: Secondary | ICD-10-CM | POA: Diagnosis not present

## 2020-08-28 DIAGNOSIS — Z13820 Encounter for screening for osteoporosis: Secondary | ICD-10-CM | POA: Diagnosis not present

## 2020-09-04 ENCOUNTER — Telehealth: Payer: Self-pay | Admitting: Pulmonary Disease

## 2020-09-04 NOTE — Telephone Encounter (Signed)
Spoke with the pt and notified of response per Dr Elsworth Soho  Pt verbalized understanding  Nothing further needed

## 2020-09-04 NOTE — Telephone Encounter (Signed)
Called spoke with patient.  She states since Sunday she has had shortness of breath and chest pain on and off. She states she is wearing her CPAP every night. She does not have oxygen or a pulse ox to check oxygen sats.  She is having a dry cough. Patient wants to know can she be seen, there is no availability in office today.  Dr. Elsworth Soho please advise.

## 2020-09-04 NOTE — Telephone Encounter (Signed)
We have seen her for sleep apnea. Since these are new symptoms , and since our office cannot work her in today, she will need evaluation in either urgent care/ER

## 2020-09-08 DIAGNOSIS — Z1212 Encounter for screening for malignant neoplasm of rectum: Secondary | ICD-10-CM | POA: Diagnosis not present

## 2020-09-17 DIAGNOSIS — G4733 Obstructive sleep apnea (adult) (pediatric): Secondary | ICD-10-CM | POA: Diagnosis not present

## 2020-10-07 DIAGNOSIS — R350 Frequency of micturition: Secondary | ICD-10-CM | POA: Diagnosis not present

## 2020-10-07 DIAGNOSIS — N3946 Mixed incontinence: Secondary | ICD-10-CM | POA: Diagnosis not present

## 2020-11-20 DIAGNOSIS — H40013 Open angle with borderline findings, low risk, bilateral: Secondary | ICD-10-CM | POA: Diagnosis not present

## 2020-12-17 DIAGNOSIS — N3946 Mixed incontinence: Secondary | ICD-10-CM | POA: Diagnosis not present

## 2020-12-24 DIAGNOSIS — H26492 Other secondary cataract, left eye: Secondary | ICD-10-CM | POA: Diagnosis not present

## 2020-12-24 DIAGNOSIS — H40013 Open angle with borderline findings, low risk, bilateral: Secondary | ICD-10-CM | POA: Diagnosis not present

## 2021-02-11 DIAGNOSIS — R350 Frequency of micturition: Secondary | ICD-10-CM | POA: Diagnosis not present

## 2021-02-11 DIAGNOSIS — N3946 Mixed incontinence: Secondary | ICD-10-CM | POA: Diagnosis not present

## 2021-02-23 DIAGNOSIS — I1310 Hypertensive heart and chronic kidney disease without heart failure, with stage 1 through stage 4 chronic kidney disease, or unspecified chronic kidney disease: Secondary | ICD-10-CM | POA: Diagnosis not present

## 2021-02-23 DIAGNOSIS — Z6839 Body mass index (BMI) 39.0-39.9, adult: Secondary | ICD-10-CM | POA: Diagnosis not present

## 2021-02-23 DIAGNOSIS — E785 Hyperlipidemia, unspecified: Secondary | ICD-10-CM | POA: Diagnosis not present

## 2021-04-20 DIAGNOSIS — L304 Erythema intertrigo: Secondary | ICD-10-CM | POA: Diagnosis not present

## 2021-04-20 DIAGNOSIS — L819 Disorder of pigmentation, unspecified: Secondary | ICD-10-CM | POA: Diagnosis not present

## 2021-04-20 DIAGNOSIS — D225 Melanocytic nevi of trunk: Secondary | ICD-10-CM | POA: Diagnosis not present

## 2021-04-20 DIAGNOSIS — L821 Other seborrheic keratosis: Secondary | ICD-10-CM | POA: Diagnosis not present

## 2021-04-30 ENCOUNTER — Encounter (HOSPITAL_COMMUNITY): Payer: Self-pay

## 2021-04-30 ENCOUNTER — Other Ambulatory Visit: Payer: Self-pay

## 2021-04-30 ENCOUNTER — Ambulatory Visit (HOSPITAL_COMMUNITY)
Admission: EM | Admit: 2021-04-30 | Discharge: 2021-04-30 | Disposition: A | Discharge status: Home / Self Care | Payer: BC Managed Care – PPO | Attending: Internal Medicine | Admitting: Internal Medicine

## 2021-04-30 DIAGNOSIS — H6123 Impacted cerumen, bilateral: Secondary | ICD-10-CM

## 2021-04-30 DIAGNOSIS — H9313 Tinnitus, bilateral: Secondary | ICD-10-CM

## 2021-04-30 DIAGNOSIS — H6120 Impacted cerumen, unspecified ear: Secondary | ICD-10-CM

## 2021-04-30 MED ORDER — CARBAMIDE PEROXIDE 6.5 % OT SOLN: 5.0000 [drp] | Freq: Two times a day (BID) | OTIC | 0 refills | Status: DC

## 2021-04-30 NOTE — Discharge Instructions (Addendum)
Please use medications as prescribed If you continue to have symptoms after 3 days please return to urgent care to be reevaluated If you develop worsening ear pain, loss of hearing please return to urgent care sooner.

## 2021-04-30 NOTE — ED Provider Notes (Signed)
[facility]    CSN: [id number] Arrival date & time: 04/30/21  1353      History   Chief Complaint Chief Complaint  Patient presents with   Ear Pain     HPI [name] is a 80 y.o. female comes to the urgent care with several weeks history of bilateral ear discomfort with humming sound in the ears.  Patient has occasional ringing in both ears with some occasional dizziness.  Patient denies any significant decrease in hearing.  Patient was treated with some Flonase presumably for sinusitis.  This has not helped with her symptoms.  Patient denies any vomiting or diarrhea.  No upper respiratory infection symptoms preceding this year complaints.  Patient denies any headaches.Marland Kitchen   HPI  Past Medical History:  Diagnosis Date   Anemia    "years ago"   Arthritis    Asthma    Bradycardia    Cataract    "skin Cancer"   Chest pain    Nuclear, May, 2010, no scar or ischemia   Dyslipidemia    Ejection fraction    EF 60%, May, 2010, echo   GERD (gastroesophageal reflux disease)    Heart murmur    Hemorrhoids    Herpes simplex without mention of complication    History of skin cancer    HTN (hypertension)    Hx of colonic polyps    IBS (irritable bowel syndrome)    Overweight(278.02)    Pneumonia    Tachycardia    Rapid heart beat, October, 2011, 21 day monitor, sinus rhythm, patient senses mild increase in heart rate, low-dose beta blockade try    Patient Active Problem List   Diagnosis Date Noted   Healthcare maintenance 07/30/2020   OSA (obstructive sleep apnea) 03/23/2020   Sleep-wake cycle disorder 03/23/2020   HTN (hypertension) 12/27/2011   Mycosis fungoides ([place]) 12/27/2011   Urinary incontinence 12/27/2011   Chest pain    Ejection fraction    Tachycardia    Dyslipidemia    PALPITATIONS 07/22/2010   DYSLIPIDEMIA 02/17/2009   OBESITY, MORBID 02/17/2009   BRADYCARDIA 02/17/2009   SKIN CANCER, HX OF 02/17/2009   HERPES SIMPLEX INFECTION, TYPE  I 09/17/2008   HEMORRHOIDS 09/17/2008   IBS 09/17/2008   COLONIC POLYPS, HX OF 09/17/2008    Past Surgical History:  Procedure Laterality Date   APPENDECTOMY     CATARACT EXTRACTION Bilateral    OVARY SURGERY     PARTIAL HYSTERECTOMY     TONSILLECTOMY     vocal cord surgery     polyp removed    OB History   No obstetric history on file.      Home Medications    Prior to Admission medications   Medication Sig Start Date End Date Taking? Authorizing Provider  carbamide peroxide (DEBROX) 6.5 % OTIC solution Place 5 drops into both ears 2 (two) times daily. 04/30/21  Yes Damon Hargrove, Myrene Galas, MD  aspirin EC 81 MG tablet Take 81 mg by mouth daily.    [provider]  atorvastatin (LIPITOR) 20 MG tablet Take 20 mg by mouth daily.    [provider]  carvedilol (COREG) 6.25 MG tablet Take 6.25 mg by mouth 2 (two) times daily.  05/21/19   [provider]  furosemide (LASIX) 40 MG tablet Take 40 mg by mouth daily. 03/18/19   [provider]  meloxicam (MOBIC) 15 MG tablet TAKE 1 TABLET BY MOUTH EVERY DAY 06/27/19   Gardiner Barefoot, DPM  pantoprazole (PROTONIX) 40 MG tablet Take 40 mg by mouth daily. 05/25/20   [provider]  polyethylene glycol powder (GLYCOLAX/MIRALAX) powder Take 17 g by mouth as needed.    [provider]  triamcinolone cream (KENALOG) 0.1 % triamcinolone acetonide 0.1 % topical cream    [provider]  UNABLE TO FIND triamcinolone-dimeth-silicone    [provider]  valsartan (DIOVAN) 320 MG tablet Take 320 mg by mouth daily.    [provider]  verapamil (CALAN-SR) 240 MG CR tablet Take 240 mg by mouth daily. 11/15/17   [provider]  Vibegron 75 MG TABS Take 1 tablet by mouth daily.    [provider]    Family History Family History  Problem Relation Age of Onset   Stroke Mother    Hypertension Mother    Diabetes Father    Hypertension Father    Ovarian cancer  Maternal Grandmother    Breast cancer Cousin        x 4    Social History Social History   Tobacco Use   Smoking status: Former    Packs/day: 1.00    Years: 30.00    Pack years: 30.00    Types: Cigarettes    Quit date: 10/10/1985    Years since quitting: 35.5   Smokeless tobacco: Never  Substance Use Topics   Alcohol use: No   Drug use: No     Allergies   Methylphenidate derivatives   Review of Systems Review of Systems  HENT:  Positive for ear pain and tinnitus. Negative for congestion, ear discharge, hearing loss, sinus pressure, sinus pain and trouble swallowing.   Respiratory: Negative.    Cardiovascular: Negative.     Physical Exam Triage Vital Signs ED Triage Vitals  Enc Vitals Group     BP      Pulse      Resp      Temp      Temp src      SpO2      Weight      Height      Head Circumference      Peak Flow      Pain Score      Pain Loc      Pain Edu?      Excl. in [place]?    No data found.  Updated Vital Signs BP (!) 149/84 (BP Location: Left Arm)   Pulse 60   Temp 98.6 F (37 C) (Oral)   Resp 20   SpO2 100%   Visual Acuity Right Eye Distance:   Left Eye Distance:   Bilateral Distance:    Right Eye Near:   Left Eye Near:    Bilateral Near:     Physical Exam Vitals and nursing note reviewed.  Constitutional:      General: She is not in acute distress.    Appearance: She is not toxic-appearing or diaphoretic.  HENT:     Ears:     Comments: Bilateral ear wax impaction. Eyes:     Extraocular Movements: Extraocular movements intact.     Pupils: Pupils are equal, round, and reactive to light.  Cardiovascular:     Rate and Rhythm: Normal rate and regular rhythm.     Pulses: Normal pulses.     Heart sounds: Normal heart sounds.  Pulmonary:     Effort: Pulmonary effort is normal.     Breath sounds: Normal breath sounds.  Abdominal:     General: Abdomen  is flat. Bowel sounds are normal.  Neurological:     Mental Status: She is alert.      UC Treatments / Results  Labs (all labs ordered are listed, but only abnormal results are displayed) Labs Reviewed - No data to display  EKG   Radiology No results found.  Procedures Procedures (including critical care time)  Medications Ordered in UC Medications - No data to display  Initial Impression / Assessment and Plan / UC Course  I have reviewed the triage vital signs and the nursing notes.  Pertinent labs & imaging results that were available during my care of the patient were reviewed by me and considered in my medical decision making (see chart for details).     . 1.  Impacted cerumen with tinnitus in both ears: Debrox 5 drops in each ear 2-3 times daily for the next 3 days If patient's symptoms persist she is advised to return to urgent care to have ear irrigation done. If patient's symptoms worsen please return to the urgent care sooner Final Clinical Impressions(s) / UC Diagnoses   Final diagnoses:  Ringing in ear, bilateral  Impacted cerumen, unspecified laterality     Discharge Instructions      Please use medications as prescribed If you continue to have symptoms after 3 days please return to urgent care to be reevaluated If you develop worsening ear pain, loss of hearing please return to urgent care sooner.     ED Prescriptions     Medication Sig Dispense Auth. Provider   carbamide peroxide (DEBROX) 6.5 % OTIC solution Place 5 drops into both ears 2 (two) times daily. 15 mL Kanasia Gayman, Myrene Galas, MD      PDMP not reviewed this encounter.   Chase Picket, MD 04/30/21 1524

## 2021-04-30 NOTE — ED Triage Notes (Signed)
Pt c/o discomfort to bilateral ears, denies getting anything in ears.  Started: several weeks ago Interventions: PCP provider recommended Flonase- not helpful

## 2021-05-05 ENCOUNTER — Encounter (HOSPITAL_COMMUNITY): Payer: Self-pay | Admitting: Emergency Medicine

## 2021-05-05 ENCOUNTER — Ambulatory Visit (HOSPITAL_COMMUNITY)
Admission: EM | Admit: 2021-05-05 | Discharge: 2021-05-05 | Disposition: A | Discharge status: Home / Self Care | Payer: BC Managed Care – PPO | Attending: Medical Oncology | Admitting: Medical Oncology

## 2021-05-05 ENCOUNTER — Other Ambulatory Visit: Payer: Self-pay

## 2021-05-05 DIAGNOSIS — H66012 Acute suppurative otitis media with spontaneous rupture of ear drum, left ear: Secondary | ICD-10-CM

## 2021-05-05 DIAGNOSIS — H6123 Impacted cerumen, bilateral: Secondary | ICD-10-CM | POA: Diagnosis not present

## 2021-05-05 DIAGNOSIS — H9313 Tinnitus, bilateral: Secondary | ICD-10-CM

## 2021-05-05 MED ORDER — NEOMYCIN-POLYMYXIN-HC 3.5-10000-1 OT SUSP: 4.0000 [drp] | Freq: Three times a day (TID) | OTIC | 0 refills | Status: DC

## 2021-05-05 MED ORDER — AMOXICILLIN-POT CLAVULANATE 875-125 MG PO TABS: 1.0000 | ORAL_TABLET | Freq: Two times a day (BID) | ORAL | 0 refills | Status: DC

## 2021-05-05 NOTE — ED Triage Notes (Signed)
Seen 04/30/2021 for roaring in both ears.  Told to return if not better.  Right ear is slightly better , but left ear is worse.

## 2021-05-05 NOTE — ED Notes (Addendum)
1st and 2nd attempt to remove cerumen build up unsuccessful.  Reported to Medical Provider assigned.

## 2021-05-05 NOTE — ED Provider Notes (Addendum)
[place]    CSN: [id number] Arrival date & time: 05/05/21  1507      History   Chief Complaint Chief Complaint  Patient presents with   Follow-up    HPI [name] is a 80 y.o. female.   HPI  Ear Symptoms: Patient was seen on 04/30/2021 for ringing in both ears.  At this time she discussed a humming sound in both ears.  Occasional dizziness associated.  She was previously given Flonase for symptoms without improvement.  At the time of her visit she had bilateral ear wax impaction.  It appears according to the note that she was advised to start Debrox drops 5 drops in each ear 2-3 times daily for the next 3 days.  If symptoms did not improve she was recommended to return in office for ear lavage.  Today she states that she has been doing the Debrox drops and has noticed some mild improvement in symptoms but overall still is having the humming sound of both ears.  She denies any significant loss of hearing, ear pressure, scalp tenderness or fevers.  She states that since her last visit the tinnitus has changed slightly.  She states that it is more of a drumming sound and less of a humming sound.  Other than that she is not having any new discharge.  She has not tried anything else other than the Debrox drops for symptoms currently.  Past Medical History:  Diagnosis Date   Anemia    "years ago"   Arthritis    Asthma    Bradycardia    Cataract    "skin Cancer"   Chest pain    Nuclear, May, 2010, no scar or ischemia   Dyslipidemia    Ejection fraction    EF 60%, May, 2010, echo   GERD (gastroesophageal reflux disease)    Heart murmur    Hemorrhoids    Herpes simplex without mention of complication    History of skin cancer    HTN (hypertension)    Hx of colonic polyps    IBS (irritable bowel syndrome)    Overweight(278.02)    Pneumonia    Tachycardia    Rapid heart beat, October, 2011, 21 day monitor, sinus rhythm, patient senses mild increase in  heart rate, low-dose beta blockade try    Patient Active Problem List   Diagnosis Date Noted   Healthcare maintenance 07/30/2020   OSA (obstructive sleep apnea) 03/23/2020   Sleep-wake cycle disorder 03/23/2020   HTN (hypertension) 12/27/2011   Mycosis fungoides ([place]) 12/27/2011   Urinary incontinence 12/27/2011   Chest pain    Ejection fraction    Tachycardia    Dyslipidemia    PALPITATIONS 07/22/2010   DYSLIPIDEMIA 02/17/2009   OBESITY, MORBID 02/17/2009   BRADYCARDIA 02/17/2009   SKIN CANCER, HX OF 02/17/2009   HERPES SIMPLEX INFECTION, TYPE I 09/17/2008   HEMORRHOIDS 09/17/2008   IBS 09/17/2008   COLONIC POLYPS, HX OF 09/17/2008    Past Surgical History:  Procedure Laterality Date   APPENDECTOMY     CATARACT EXTRACTION Bilateral    OVARY SURGERY     PARTIAL HYSTERECTOMY     TONSILLECTOMY     vocal cord surgery     polyp removed    OB History   No obstetric history on file.      Home Medications    Prior to Admission medications   Medication Sig Start Date End Date Taking? Authorizing Provider  aspirin EC 81 MG  tablet Take 81 mg by mouth daily.   Yes [provider]  atorvastatin (LIPITOR) 20 MG tablet Take 20 mg by mouth daily.   Yes [provider]  carvedilol (COREG) 6.25 MG tablet Take 6.25 mg by mouth 2 (two) times daily.  05/21/19  Yes [provider]  pantoprazole (PROTONIX) 40 MG tablet Take 40 mg by mouth daily. 05/25/20  Yes [provider]  valsartan (DIOVAN) 320 MG tablet Take 320 mg by mouth daily.   Yes [provider]  verapamil (CALAN-SR) 240 MG CR tablet Take 240 mg by mouth daily. 11/15/17  Yes [provider]  carbamide peroxide (DEBROX) 6.5 % OTIC solution Place 5 drops into both ears 2 (two) times daily. 04/30/21   Chase Picket, MD  furosemide (LASIX) 40 MG tablet Take 40 mg by mouth daily. 03/18/19   [provider]  meloxicam (MOBIC) 15 MG tablet TAKE 1 TABLET BY MOUTH EVERY  DAY 06/27/19   Gardiner Barefoot, DPM  polyethylene glycol powder (GLYCOLAX/MIRALAX) powder Take 17 g by mouth as needed.    [provider]  triamcinolone cream (KENALOG) 0.1 % triamcinolone acetonide 0.1 % topical cream    [provider]  UNABLE TO FIND triamcinolone-dimeth-silicone    [provider]  Vibegron 75 MG TABS Take 1 tablet by mouth daily.    [provider]    Family History Family History  Problem Relation Age of Onset   Stroke Mother    Hypertension Mother    Diabetes Father    Hypertension Father    Ovarian cancer Maternal Grandmother    Breast cancer Cousin        x 4    Social History Social History   Tobacco Use   Smoking status: Former    Packs/day: 1.00    Years: 30.00    Pack years: 30.00    Types: Cigarettes    Quit date: 10/10/1985    Years since quitting: 35.5   Smokeless tobacco: Never  Vaping Use   Vaping Use: Never used  Substance Use Topics   Alcohol use: No   Drug use: No     Allergies   Methylphenidate derivatives   Review of Systems Review of Systems  As stated above in HPI Physical Exam Triage Vital Signs ED Triage Vitals  Enc Vitals Group     BP 05/05/21 1546 (!) 126/50     Pulse Rate 05/05/21 1546 74     Resp 05/05/21 1546 (!) 22     Temp 05/05/21 1546 99 F (37.2 C)     Temp Source 05/05/21 1546 Oral     SpO2 05/05/21 1546 98 %     Weight --      Height --      Head Circumference --      Peak Flow --      Pain Score 05/05/21 1541 8     Pain Loc --      Pain Edu? --      Excl. in [place]? --    No data found.  Updated Vital Signs BP (!) 126/50 (BP Location: Right Arm) Comment (BP Location): large cuff  Pulse 74   Temp 99 F (37.2 C) (Oral)   Resp 16   SpO2 98%   Physical Exam Vitals and nursing note reviewed.  Constitutional:      Appearance: Normal appearance.  HENT:     Head: Normocephalic and atraumatic.     Right Ear: Hearing normal.  There is impacted cerumen.      Left Ear: Hearing normal. There is impacted cerumen.     Ears:     Comments: No tenderness of bilateral scalp or rash visible. Neurological:     Mental Status: She is alert.     UC Treatments / Results  Labs (all labs ordered are listed, but only abnormal results are displayed) Labs Reviewed - No data to display  EKG   Radiology No results found.  Procedures Procedures (including critical care time)  Medications Ordered in UC Medications - No data to display  Initial Impression / Assessment and Plan / UC Course  I have reviewed the triage vital signs and the nursing notes.  Pertinent labs & imaging results that were available during my care of the patient were reviewed by me and considered in my medical decision making (see chart for details).     New.  Ear lavage to be performed in office today by myself using ear curette and CMA using warm water.  Ear lavage removed a significant amount of cerumen and a white film of debris along the canal.  Visualization of otitis media with a potential perforation of the left ear.  We will treat accordingly both for otitis externa and acute otitis media to prevent further complication and systemic infection.  Discussed with patient along with how to take medications.  If symptoms do not improve I would recommend ENT follow-up. Final Clinical Impressions(s) / UC Diagnoses   Final diagnoses:  None   Discharge Instructions   None    ED Prescriptions   None    PDMP not reviewed this encounter.   Hughie Closs, Hershal Coria 05/05/21 2033    Hughie Closs, PA-C 05/05/21 2034

## 2021-05-12 DIAGNOSIS — M25561 Pain in right knee: Secondary | ICD-10-CM | POA: Diagnosis not present

## 2021-05-12 DIAGNOSIS — M170 Bilateral primary osteoarthritis of knee: Secondary | ICD-10-CM | POA: Diagnosis not present

## 2021-05-12 DIAGNOSIS — M179 Osteoarthritis of knee, unspecified: Secondary | ICD-10-CM | POA: Diagnosis not present

## 2021-05-12 DIAGNOSIS — M2392 Unspecified internal derangement of left knee: Secondary | ICD-10-CM | POA: Diagnosis not present

## 2021-05-17 NOTE — Progress Notes (Signed)
Cardiology Office Note:    Date:  05/18/2021   ID:  [name], DOB 30-Sep-1941, MRN [id number]  PCP:  Michael Boston, MD  Cardiologist:  None   Referring MD: Michael Boston, MD   No chief complaint on file.   History of Present Illness:    [name] is a 80 y.o. female with a hx of dyspnea on exertion.  Prior history of normal LV function with EF 60% 2010, hyperlipidemia, primary hypertension, obesity, history of rheumatic fever as a child, severe obstructive sleep apnea on CPAP,, former smoker, and palpitations.   The patient has a greater than 3-year history of significant dyspnea on exertion and accompanied by chest discomfort.  This problem is quality of life altering.  She is unable to do things such as shopping in the mall, walking any distance to shop for groceries, going to a museum, and walking for sightseeing when on vacation.  This started gradually and seems to have gotten worse over the pandemic.  She denies orthopnea and PND.  She has a diagnosed severe obstructive sleep apnea problem and is using CPAP although not consistently.  She has occasional lower extremity swelling.  There is no prior history of vascular disease.  There is no family history of vascular disease.  She has seen cardiology in 2012, Dr. Ron Parker.  At that time she had bradycardia associated with the use of verapamil and atenolol.  Verapamil was discontinued and the heart rate improved.  More recently, the patient has been on carvedilol but this medicine was discontinued by Dr. Jacalyn Lefevre.  She continues on Diovan, [employer], furosemide, and tries to observe a low-salt diet.  Recently she has been using nonsteroidal therapy in the form of Mobic because of a knee injury.  She takes this medication as needed.  Past Medical History:  Diagnosis Date   Anemia    "years ago"   Arthritis    Asthma    Bradycardia    Cataract    "skin Cancer"   Chest pain    Nuclear, May, 2010, no scar or ischemia    Dyslipidemia    Ejection fraction    EF 60%, May, 2010, echo   GERD (gastroesophageal reflux disease)    Heart murmur    Hemorrhoids    Herpes simplex without mention of complication    History of skin cancer    HTN (hypertension)    Hx of colonic polyps    IBS (irritable bowel syndrome)    Overweight(278.02)    Pneumonia    Tachycardia    Rapid heart beat, October, 2011, 21 day monitor, sinus rhythm, patient senses mild increase in heart rate, low-dose beta blockade try    Past Surgical History:  Procedure Laterality Date   APPENDECTOMY     CATARACT EXTRACTION Bilateral    OVARY SURGERY     PARTIAL HYSTERECTOMY     TONSILLECTOMY     vocal cord surgery     polyp removed    Current Medications: Current Meds  Medication Sig   Amoxicillin (MOXATAG PO) amoxicillin   amoxicillin-clavulanate (AUGMENTIN) 875-125 MG tablet Take 1 tablet by mouth every 12 (twelve) hours.   aspirin EC 81 MG tablet Take 81 mg by mouth daily.   atorvastatin (LIPITOR) 20 MG tablet Take 20 mg by mouth daily.   carvedilol (COREG) 6.25 MG tablet Take 6.25 mg by mouth as needed.   dorzolamide-timolol (COSOPT) 22.3-6.8 MG/ML ophthalmic solution Place 1 drop into the right eye  2 (two) times daily.   furosemide (LASIX) 40 MG tablet Take 40 mg by mouth daily.   meloxicam (MOBIC) 15 MG tablet TAKE 1 TABLET BY MOUTH EVERY DAY (Patient taking differently: Take 15 mg by mouth as needed.)   metoprolol tartrate (LOPRESSOR) 100 MG tablet Take one tablet by mouth 2 hours prior to your CT   Neomycin-Bacitracin Zn-Polymyx (NEO-POLYCIN OP) neomycin-bacitracin-polymyxin   neomycin-polymyxin-hydrocortisone (CORTISPORIN) 3.5-10000-1 OTIC suspension Place 4 drops into both ears 3 (three) times daily.   pantoprazole (PROTONIX) 40 MG tablet Take 40 mg by mouth daily.   polyethylene glycol powder (GLYCOLAX/MIRALAX) powder Take 17 g by mouth as needed.   triamcinolone cream (KENALOG) 0.1 % triamcinolone acetonide 0.1 % topical  cream   UNABLE TO FIND triamcinolone-dimeth-silicone   valsartan (DIOVAN) 320 MG tablet Take 320 mg by mouth daily.   verapamil (CALAN-SR) 240 MG CR tablet Take 240 mg by mouth daily.   Vibegron 75 MG TABS Take 1 tablet by mouth daily.     Allergies:   Methylphenidate derivatives   Social History   Socioeconomic History   Marital status: Divorced    Spouse name: Not on file   Number of children: 0   Years of education: Not on file   Highest education level: Not on file  Occupational History   Occupation: retired  Tobacco Use   Smoking status: Former    Packs/day: 1.00    Years: 30.00    Pack years: 30.00    Types: Cigarettes    Quit date: 10/10/1985    Years since quitting: 35.6   Smokeless tobacco: Never  Vaping Use   Vaping Use: Never used  Substance and Sexual Activity   Alcohol use: No   Drug use: No   Sexual activity: Not on file  Other Topics Concern   Not on file  Social History Narrative   Not on file   Social Determinants of Health   Financial Resource Strain: Not on file  Food Insecurity: Not on file  Transportation Needs: Not on file  Physical Activity: Not on file  Stress: Not on file  Social Connections: Not on file     Family History: The patient's family history includes Breast cancer in her cousin; Diabetes in her father; Hypertension in her father and mother; Ovarian cancer in her maternal grandmother; Stroke in her mother.  ROS:   Please see the history of present illness.    Some difficulty sleeping.  She occasionally falls asleep before she puts on CPAP.  She has glaucoma.  She uses eyedrops.  She is walking with a cane because of an injury to her left knee which was incurred when she fell down the escalator at [employer] earlier this year.  Ll other systems reviewed and are negative.  EKGs/Labs/Other Studies Reviewed:    The following studies were reviewed today:  2D Doppler echocardiogram 03/03/2020: IMPRESSIONS    1. Normal LV systolic function; grade 1 diastolic dysfunction; mild LVH:  mild AI.   2. Left ventricular ejection fraction, by estimation, is 60 to 65%. The  left ventricle has normal function. The left ventricle has no regional  wall motion abnormalities. There is mild left ventricular hypertrophy.  Left ventricular diastolic parameters  are consistent with Grade I diastolic dysfunction (impaired relaxation).   3. Right ventricular systolic function is normal. The right ventricular  size is normal. There is normal pulmonary artery systolic pressure.   4. The mitral valve is normal in structure. Trivial  mitral valve  regurgitation. No evidence of mitral stenosis.   5. The aortic valve is tricuspid. Aortic valve regurgitation is mild.  Mild aortic valve sclerosis is present, with no evidence of aortic valve  stenosis.   6. The inferior vena cava is normal in size with greater than 50%  respiratory variability, suggesting right atrial pressure of 3 mmHg.   7-day monitor performed 03/26/2020: Study Highlights  Sinus bradycardia, normal sinus rhythm and sinus tachycardia. The average heart rate was 67bpm and ranged from 42 to 126bpm. Occasional PVCs and ventricular couplets. PACs and nonsustained atrial tachcyardia up to 12 beats.   EKG:  EKG sinus rhythm, tall R wave V1 and V2, left anterior hemiblock, sinus rhythm with normal PR.  This is a prior tracing in 2020, tall R wave in V1 and V2 with T wave abnormality is new.  Left axis deviation is more severe.  Recent Labs: No results found for requested labs within last 8760 hours.  Recent Lipid Panel    Component Value Date/Time   CHOL  02/13/2009 0428    195        ATP III CLASSIFICATION:  <200     mg/dL   Desirable  200-239  mg/dL   Borderline High  >=240    mg/dL   High          TRIG 97 02/13/2009 0428   HDL 33 (L) 02/13/2009 0428   CHOLHDL 5.9 02/13/2009 0428   VLDL 19 02/13/2009 0428   LDLCALC (H) 02/13/2009 0428    143         Total Cholesterol/HDL:CHD Risk Coronary Heart Disease Risk Table                     Men   Women  1/2 Average Risk   3.4   3.3  Average Risk       5.0   4.4  2 X Average Risk   9.6   7.1  3 X Average Risk  23.4   11.0        Use the calculated Patient Ratio above and the CHD Risk Table to determine the patient's CHD Risk.        ATP III CLASSIFICATION (LDL):  <100     mg/dL   Optimal  100-129  mg/dL   Near or Above                    Optimal  130-159  mg/dL   Borderline  160-189  mg/dL   High  >190     mg/dL   Very High    Physical Exam:    VS:  BP 122/72   Pulse 78   Ht 5' 2.5" (1.588 m)   Wt 234 lb 6.4 oz (106.3 kg)   SpO2 96%   BMI 42.19 kg/m     Wt Readings from Last 3 Encounters:  05/18/21 234 lb 6.4 oz (106.3 kg)  07/30/20 235 lb (106.6 kg)  03/23/20 233 lb 3.2 oz (105.8 kg)     GEN: Morbidly obese. No acute distress HEENT: Normal NECK: No JVD. LYMPHATICS: No lymphadenopathy CARDIAC: Soft left parasternal to right upper sternal systolic murmur murmur. RRR no gallop, or edema. VASCULAR:  Normal Pulses. No bruits. RESPIRATORY:  Clear to auscultation without rales, wheezing or rhonchi  ABDOMEN: Soft, non-tender, non-distended, No pulsatile mass, MUSCULOSKELETAL: No deformity  SKIN: Warm and dry NEUROLOGIC:  Alert and oriented x 3 PSYCHIATRIC:  Normal affect  ASSESSMENT:    1. Dyspnea on exertion   2. Palpitations   3. Chronic diastolic heart failure ([place])   4. OSA (obstructive sleep apnea)   5. Primary hypertension    PLAN:    In order of problems listed above:  Probably multifactorial.  I am suspicious that there is a component of diastolic heart failure.  She also has morbid obesity, physical deconditioning, severe obstructive sleep apnea that is being treated with CPAP and consistently, and is a prior smoker.  We will plan to exclude coronary disease and remove anginal equivalent from the equation by performing a coronary CTA with FFR if  indicated. Not significant No clinical evidence of volume overload on exam.  If no coronary disease, consider adding SGLT2 therapy and referring to cardiac rehab. Encouraged the patient to use CPAP consistently.   Blood pressure control today is adequate     Medication Adjustments/Labs and Tests Ordered: Current medicines are reviewed at length with the patient today.  Concerns regarding medicines are outlined above.  Orders Placed This Encounter  Procedures   CT CORONARY MORPH W/CTA COR W/SCORE W/CA W/CM &/OR WO/CM   Basic metabolic panel   EKG [id number]    Meds ordered this encounter  Medications   metoprolol tartrate (LOPRESSOR) 100 MG tablet    Sig: Take one tablet by mouth 2 hours prior to your CT    Dispense:  1 tablet    Refill:  0     Patient Instructions  Medication Instructions:  Your physician recommends that you continue on your current medications as directed. Please refer to the Current Medication list given to you today.  *If you need a refill on your cardiac medications before your next appointment, please call your pharmacy*   Lab Work: None If you have labs (blood work) drawn today and your tests are completely normal, you will receive your results only by: [place] (if you have MyChart) OR A paper copy in the mail If you have any lab test that is abnormal or we need to change your treatment, we will call you to review the results.   Testing/Procedures: Your physician recommends that you have a Coronary CT performed.  See next page for instructions.   Follow-Up: At [facility], you and your health needs are our priority.  As part of our continuing mission to provide you with exceptional heart care, we have created designated Provider Care Teams.  These Care Teams include your primary Cardiologist (physician) and Advanced Practice Providers (APPs -  Physician Assistants and Nurse Practitioners) who all work together to provide you with the care  you need, when you need it.  We recommend signing up for the patient portal called "MyChart".  Sign up information is provided on this After Visit Summary.  MyChart is used to connect with patients for Virtual Visits (Telemedicine).  Patients are able to view lab/test results, encounter notes, upcoming appointments, etc.  Non-urgent messages can be sent to your provider as well.   To learn more about what you can do with MyChart, go to [email or web address].    Your next appointment:   As needed  The format for your next appointment:   In Person  Provider:   You may see Daneen Schick, MD or one of the following Advanced Practice Providers on your designated Care Team:   Cecilie Kicks, NP    Other Instructions   Your cardiac CT will be scheduled at one of the below locations:   [facility]  [facility] [place], [place] [address] [phone]  [place] [address] [place], [place] [address] [phone]  If scheduled at [facility], please arrive at the [facility] main entrance (entrance A) of [facility] 30 minutes prior to test start time. Proceed to the [facility] Radiology Department (first floor) to check-in and test prep.  If scheduled at [facility], please arrive 15 mins early for check-in and test prep.  Please follow these instructions carefully (unless otherwise directed):  Hold all erectile dysfunction medications at least 3 days (72 hrs) prior to test.  On the Night Before the Test: Be sure to Drink plenty of water. Do not consume any caffeinated/decaffeinated beverages or chocolate 12 hours prior to your test. Do not take any antihistamines 12 hours prior to your test.  On the Day of the Test: Drink plenty of water until 1 hour prior to the test. Do not eat any food 4 hours prior to the test. You may take your regular medications prior to the  test.  Take metoprolol (Lopressor) two hours prior to test.  Hold your Verapamil the morning of your test. HOLD Furosemide/Hydrochlorothiazide morning of the test. FEMALES- please wear underwire-free bra if available, avoid dresses & tight clothing       After the Test: Drink plenty of water. After receiving IV contrast, you may experience a mild flushed feeling. This is normal. On occasion, you may experience a mild rash up to 24 hours after the test. This is not dangerous. If this occurs, you can take Benadryl 25 mg and increase your fluid intake. If you experience trouble breathing, this can be serious. If it is severe call 911 IMMEDIATELY. If it is mild, please call our office. If you take any of these medications: Glipizide/Metformin, Avandament, Glucavance, please do not take 48 hours after completing test unless otherwise instructed.  Please allow 2-4 weeks for scheduling of routine cardiac CTs. Some insurance companies require a pre-authorization which may delay scheduling of this test.   For non-scheduling related questions, please contact the cardiac imaging nurse navigator should you have any questions/concerns: Marchia Bond, Cardiac Imaging Nurse Navigator Gordy Clement, Cardiac Imaging Nurse Navigator [facility] Direct Office Dial: [phone]   For scheduling needs, including cancellations and rescheduling, please call [place], [phone].     Signed, Sinclair Grooms, MD  05/18/2021 3:00 PM    [place]

## 2021-05-18 ENCOUNTER — Encounter: Payer: Self-pay | Admitting: Interventional Cardiology

## 2021-05-18 ENCOUNTER — Other Ambulatory Visit: Payer: Self-pay

## 2021-05-18 ENCOUNTER — Ambulatory Visit (INDEPENDENT_AMBULATORY_CARE_PROVIDER_SITE_OTHER): Payer: Medicare Other | Admitting: Interventional Cardiology

## 2021-05-18 VITALS — BP 122/72 | HR 78 | Ht 62.5 in | Wt 234.4 lb

## 2021-05-18 DIAGNOSIS — G4733 Obstructive sleep apnea (adult) (pediatric): Secondary | ICD-10-CM

## 2021-05-18 DIAGNOSIS — R0600 Dyspnea, unspecified: Secondary | ICD-10-CM | POA: Diagnosis not present

## 2021-05-18 DIAGNOSIS — R002 Palpitations: Secondary | ICD-10-CM | POA: Diagnosis not present

## 2021-05-18 DIAGNOSIS — I5032 Chronic diastolic (congestive) heart failure: Secondary | ICD-10-CM | POA: Diagnosis not present

## 2021-05-18 DIAGNOSIS — R0609 Other forms of dyspnea: Secondary | ICD-10-CM

## 2021-05-18 DIAGNOSIS — I10 Essential (primary) hypertension: Secondary | ICD-10-CM

## 2021-05-18 MED ORDER — METOPROLOL TARTRATE 100 MG PO TABS: ORAL_TABLET | ORAL | 0 refills | Status: DC

## 2021-05-18 NOTE — Addendum Note (Signed)
Addended by: Loren Racer on: 05/18/2021 03:36 PM   Modules accepted: Orders

## 2021-05-18 NOTE — Patient Instructions (Addendum)
Medication Instructions:  Your physician recommends that you continue on your current medications as directed. Please refer to the Current Medication list given to you today.  *If you need a refill on your cardiac medications before your next appointment, please call your pharmacy*   Lab Work: None If you have labs (blood work) drawn today and your tests are completely normal, you will receive your results only by: [place] (if you have MyChart) OR A paper copy in the mail If you have any lab test that is abnormal or we need to change your treatment, we will call you to review the results.   Testing/Procedures: Your physician recommends that you have a Coronary CT performed.  See next page for instructions.   Follow-Up: At [facility], you and your health needs are our priority.  As part of our continuing mission to provide you with exceptional heart care, we have created designated Provider Care Teams.  These Care Teams include your primary Cardiologist (physician) and Advanced Practice Providers (APPs -  Physician Assistants and Nurse Practitioners) who all work together to provide you with the care you need, when you need it.  We recommend signing up for the patient portal called "MyChart".  Sign up information is provided on this After Visit Summary.  MyChart is used to connect with patients for Virtual Visits (Telemedicine).  Patients are able to view lab/test results, encounter notes, upcoming appointments, etc.  Non-urgent messages can be sent to your provider as well.   To learn more about what you can do with MyChart, go to [email or web address].    Your next appointment:   As needed  The format for your next appointment:   In Person  Provider:   You may see Daneen Schick, MD or one of the following Advanced Practice Providers on your designated Care Team:   Cecilie Kicks, NP    Other Instructions   Your cardiac CT will be scheduled at one of the below  locations:   [facility] [address] [place], [place] [address] [phone]  [place] [address] [place], [place] [address] [phone]  If scheduled at [facility], please arrive at the [facility] main entrance (entrance A) of [facility] 30 minutes prior to test start time. Proceed to the [facility] Radiology Department (first floor) to check-in and test prep.  If scheduled at [facility], please arrive 15 mins early for check-in and test prep.  Please follow these instructions carefully (unless otherwise directed):  Hold all erectile dysfunction medications at least 3 days (72 hrs) prior to test.  On the Night Before the Test: Be sure to Drink plenty of water. Do not consume any caffeinated/decaffeinated beverages or chocolate 12 hours prior to your test. Do not take any antihistamines 12 hours prior to your test.  On the Day of the Test: Drink plenty of water until 1 hour prior to the test. Do not eat any food 4 hours prior to the test. You may take your regular medications prior to the test.  Take metoprolol (Lopressor) two hours prior to test.  Hold your Verapamil the morning of your test. HOLD Furosemide/Hydrochlorothiazide morning of the test. FEMALES- please wear underwire-free bra if available, avoid dresses & tight clothing       After the Test: Drink plenty of water. After receiving IV contrast, you may experience a mild flushed feeling. This is normal. On occasion, you  may experience a mild rash up to 24 hours after the test. This is not dangerous. If this occurs, you can take Benadryl 25 mg and increase your fluid intake. If you experience trouble breathing, this can be serious. If it is severe call 911 IMMEDIATELY. If it is mild, please call our office. If you take any of these medications: Glipizide/Metformin, Avandament, Glucavance, please do  not take 48 hours after completing test unless otherwise instructed.  Please allow 2-4 weeks for scheduling of routine cardiac CTs. Some insurance companies require a pre-authorization which may delay scheduling of this test.   For non-scheduling related questions, please contact the cardiac imaging nurse navigator should you have any questions/concerns: Marchia Bond, Cardiac Imaging Nurse Navigator Gordy Clement, Cardiac Imaging Nurse Navigator [facility] Direct Office Dial: [phone]   For scheduling needs, including cancellations and rescheduling, please call [place], [phone].

## 2021-05-21 DIAGNOSIS — M2392 Unspecified internal derangement of left knee: Secondary | ICD-10-CM | POA: Diagnosis not present

## 2021-05-21 DIAGNOSIS — M170 Bilateral primary osteoarthritis of knee: Secondary | ICD-10-CM | POA: Diagnosis not present

## 2021-05-25 ENCOUNTER — Other Ambulatory Visit: Payer: Self-pay

## 2021-05-25 ENCOUNTER — Other Ambulatory Visit: Payer: Medicare Other

## 2021-05-25 DIAGNOSIS — R0600 Dyspnea, unspecified: Secondary | ICD-10-CM | POA: Diagnosis not present

## 2021-05-25 DIAGNOSIS — R0609 Other forms of dyspnea: Secondary | ICD-10-CM

## 2021-05-26 LAB — BASIC METABOLIC PANEL
BUN/Creatinine Ratio: 17 (ref 12–28)
BUN: 16 mg/dL (ref 8–27)
CO2: 25 mmol/L (ref 20–29)
Calcium: 9 mg/dL (ref 8.7–10.3)
Chloride: 104 mmol/L (ref 96–106)
Creatinine, Ser: 0.96 mg/dL (ref 0.57–1.00)
Glucose: 111 mg/dL — ABNORMAL HIGH (ref 65–99)
Potassium: 4.7 mmol/L (ref 3.5–5.2)
Sodium: 142 mmol/L (ref 134–144)
eGFR: 61 mL/min/{1.73_m2} (ref >59)

## 2021-05-31 ENCOUNTER — Telehealth (HOSPITAL_COMMUNITY): Payer: Self-pay | Admitting: *Deleted

## 2021-05-31 NOTE — Telephone Encounter (Signed)
Reaching out to patient to offer assistance regarding upcoming cardiac imaging study; pt verbalizes understanding of appt date/time, parking situation and where to check in, pre-test NPO status and medications ordered, and verified current allergies; name and call back number provided for further questions should they arise  Gordy Clement RN Navigator Cardiac Imaging [facility] [phone] office [phone] cell  Patient to take '100mg'$  metoprolol tartrate two hour prior to cardiac CT.

## 2021-06-01 ENCOUNTER — Other Ambulatory Visit: Payer: Self-pay

## 2021-06-01 ENCOUNTER — Encounter (HOSPITAL_COMMUNITY): Payer: Self-pay

## 2021-06-01 ENCOUNTER — Ambulatory Visit (HOSPITAL_COMMUNITY)
Admission: RE | Admit: 2021-06-01 | Discharge: 2021-06-01 | Disposition: A | Discharge status: Home / Self Care | Payer: Medicare Other | Source: Ambulatory Visit | Attending: Interventional Cardiology | Admitting: Interventional Cardiology

## 2021-06-01 DIAGNOSIS — I700 Atherosclerosis of aorta: Secondary | ICD-10-CM | POA: Insufficient documentation

## 2021-06-01 DIAGNOSIS — R0609 Other forms of dyspnea: Secondary | ICD-10-CM

## 2021-06-01 DIAGNOSIS — R0600 Dyspnea, unspecified: Secondary | ICD-10-CM | POA: Insufficient documentation

## 2021-06-01 MED ORDER — METOPROLOL TARTRATE 5 MG/5ML IV SOLN: 5.0000 mg | INTRAVENOUS | Status: DC | PRN

## 2021-06-01 MED ORDER — NITROGLYCERIN 0.4 MG SL SUBL
SUBLINGUAL_TABLET | SUBLINGUAL | Status: AC
  Filled 2021-06-01: qty 2

## 2021-06-01 MED ORDER — NITROGLYCERIN 0.4 MG SL SUBL
0.8000 mg | SUBLINGUAL_TABLET | Freq: Once | SUBLINGUAL | Status: AC
  Administered 2021-06-01: 0.8 mg via SUBLINGUAL

## 2021-06-01 MED ORDER — IOHEXOL 350 MG/ML SOLN
80.0000 mL | Freq: Once | INTRAVENOUS | Status: AC | PRN
  Administered 2021-06-01: 80 mL via INTRAVENOUS

## 2021-06-01 MED ORDER — METOPROLOL TARTRATE 5 MG/5ML IV SOLN
INTRAVENOUS | Status: AC
  Administered 2021-06-01: 5 mg
  Filled 2021-06-01: qty 5

## 2021-06-01 MED ORDER — IOHEXOL 350 MG/ML SOLN
95.0000 mL | Freq: Once | INTRAVENOUS | Status: AC | PRN
  Administered 2021-06-01: 95 mL via INTRAVENOUS

## 2021-06-09 DIAGNOSIS — M25561 Pain in right knee: Secondary | ICD-10-CM | POA: Diagnosis not present

## 2021-06-09 DIAGNOSIS — M25562 Pain in left knee: Secondary | ICD-10-CM | POA: Diagnosis not present

## 2021-06-15 DIAGNOSIS — M25562 Pain in left knee: Secondary | ICD-10-CM | POA: Diagnosis not present

## 2021-06-15 DIAGNOSIS — M25561 Pain in right knee: Secondary | ICD-10-CM | POA: Diagnosis not present

## 2021-06-18 DIAGNOSIS — M25562 Pain in left knee: Secondary | ICD-10-CM | POA: Diagnosis not present

## 2021-06-18 DIAGNOSIS — M25561 Pain in right knee: Secondary | ICD-10-CM | POA: Diagnosis not present

## 2021-06-23 DIAGNOSIS — M25562 Pain in left knee: Secondary | ICD-10-CM | POA: Diagnosis not present

## 2021-06-23 DIAGNOSIS — M25561 Pain in right knee: Secondary | ICD-10-CM | POA: Diagnosis not present

## 2021-06-25 DIAGNOSIS — M25562 Pain in left knee: Secondary | ICD-10-CM | POA: Diagnosis not present

## 2021-06-25 DIAGNOSIS — M25561 Pain in right knee: Secondary | ICD-10-CM | POA: Diagnosis not present

## 2021-06-29 DIAGNOSIS — M25561 Pain in right knee: Secondary | ICD-10-CM | POA: Diagnosis not present

## 2021-06-29 DIAGNOSIS — M25562 Pain in left knee: Secondary | ICD-10-CM | POA: Diagnosis not present

## 2021-07-02 DIAGNOSIS — I1310 Hypertensive heart and chronic kidney disease without heart failure, with stage 1 through stage 4 chronic kidney disease, or unspecified chronic kidney disease: Secondary | ICD-10-CM | POA: Diagnosis not present

## 2021-07-02 DIAGNOSIS — Z6839 Body mass index (BMI) 39.0-39.9, adult: Secondary | ICD-10-CM | POA: Diagnosis not present

## 2021-07-02 DIAGNOSIS — R519 Headache, unspecified: Secondary | ICD-10-CM | POA: Diagnosis not present

## 2021-07-02 DIAGNOSIS — M170 Bilateral primary osteoarthritis of knee: Secondary | ICD-10-CM | POA: Diagnosis not present

## 2021-07-06 DIAGNOSIS — M25562 Pain in left knee: Secondary | ICD-10-CM | POA: Diagnosis not present

## 2021-07-06 DIAGNOSIS — M25561 Pain in right knee: Secondary | ICD-10-CM | POA: Diagnosis not present

## 2021-07-15 DIAGNOSIS — H6123 Impacted cerumen, bilateral: Secondary | ICD-10-CM | POA: Diagnosis not present

## 2021-07-15 DIAGNOSIS — H903 Sensorineural hearing loss, bilateral: Secondary | ICD-10-CM | POA: Diagnosis not present

## 2021-07-20 DIAGNOSIS — M25561 Pain in right knee: Secondary | ICD-10-CM | POA: Diagnosis not present

## 2021-07-20 DIAGNOSIS — M25562 Pain in left knee: Secondary | ICD-10-CM | POA: Diagnosis not present

## 2021-07-26 DIAGNOSIS — S46002A Unspecified injury of muscle(s) and tendon(s) of the rotator cuff of left shoulder, initial encounter: Secondary | ICD-10-CM | POA: Diagnosis not present

## 2021-07-26 DIAGNOSIS — M25512 Pain in left shoulder: Secondary | ICD-10-CM | POA: Diagnosis not present

## 2021-07-28 DIAGNOSIS — H40013 Open angle with borderline findings, low risk, bilateral: Secondary | ICD-10-CM | POA: Diagnosis not present

## 2021-08-02 DIAGNOSIS — Z6839 Body mass index (BMI) 39.0-39.9, adult: Secondary | ICD-10-CM | POA: Diagnosis not present

## 2021-08-02 DIAGNOSIS — R519 Headache, unspecified: Secondary | ICD-10-CM | POA: Diagnosis not present

## 2021-08-02 DIAGNOSIS — I1310 Hypertensive heart and chronic kidney disease without heart failure, with stage 1 through stage 4 chronic kidney disease, or unspecified chronic kidney disease: Secondary | ICD-10-CM | POA: Diagnosis not present

## 2021-08-03 ENCOUNTER — Encounter: Payer: Self-pay | Admitting: Pulmonary Disease

## 2021-08-03 ENCOUNTER — Other Ambulatory Visit: Payer: Self-pay

## 2021-08-03 ENCOUNTER — Ambulatory Visit (INDEPENDENT_AMBULATORY_CARE_PROVIDER_SITE_OTHER): Payer: Medicare Other | Admitting: Pulmonary Disease

## 2021-08-03 DIAGNOSIS — G4733 Obstructive sleep apnea (adult) (pediatric): Secondary | ICD-10-CM | POA: Diagnosis not present

## 2021-08-03 DIAGNOSIS — G4720 Circadian rhythm sleep disorder, unspecified type: Secondary | ICD-10-CM | POA: Diagnosis not present

## 2021-08-03 NOTE — Patient Instructions (Signed)
CPAP is working well CPAP supplies will be renewed x 1 year

## 2021-08-03 NOTE — Assessment & Plan Note (Signed)
Encouraged her to set fixed bedtime and ensure sleep quantity of at least 6 hours per night

## 2021-08-03 NOTE — Progress Notes (Signed)
   Subjective:    Patient ID: [name], female    DOB: 18-Apr-1941, 80 y.o.   MRN: [id number]  HPI  80 year old for follow-up of OSA and circadian rhythm disorder, freerunning type  After sleep study showed severe OSA, she was set up with auto CPAP.  She has been using this for about a year and presents for follow-up. She has several complaints she complains of tinnitus in her years and wonders if this is related to CPAP.  She complains of dry mouth and dry eye, she is using a nasal mask and some air seems to leak into her eyes, she has tried an eye mask too. Denies any problems with pressure Weight is unchanged  Significant tests/ events reviewed 04/2020 HST showed severe OSA >> AHI 32/h  Review of Systems neg for any significant sore throat, dysphagia, itching, sneezing, nasal congestion or excess/ purulent secretions, fever, chills, sweats, unintended wt loss, pleuritic or exertional cp, hempoptysis, orthopnea pnd or change in chronic leg swelling. Also denies presyncope, palpitations, heartburn, abdominal pain, nausea, vomiting, diarrhea or change in bowel or urinary habits, dysuria,hematuria, rash, arthralgias, visual complaints, headache, numbness weakness or ataxia.     Objective:   Physical Exam  Gen. Pleasant, obese, in no distress ENT - no lesions, no post nasal drip Neck: No JVD, no thyromegaly, no carotid bruits Lungs: no use of accessory muscles, no dullness to percussion, decreased without rales or rhonchi  Cardiovascular: Rhythm regular, heart sounds  normal, no murmurs or gallops, no peripheral edema Musculoskeletal: No deformities, no cyanosis or clubbing , no tremors        Assessment & Plan:

## 2021-08-03 NOTE — Assessment & Plan Note (Signed)
CPAP download was reviewed which shows excellent control of events with average pressure 13.5 on auto CPAP settings 5 to 15 cm.  She has mild leak and average compliance is more than 6 hours per night with only 1 missed night in the last month. Overall CPAP seems to be very effective and is certainly helped improve her daytime somnolence and fatigue Weight loss encouraged, compliance with goal of at least 4-6 hrs every night is the expectation. Advised against medications with sedative side effects Cautioned against driving when sleepy - understanding that sleepiness will vary on a day to day basis

## 2021-08-04 DIAGNOSIS — Z1231 Encounter for screening mammogram for malignant neoplasm of breast: Secondary | ICD-10-CM | POA: Diagnosis not present

## 2021-08-09 DIAGNOSIS — M25512 Pain in left shoulder: Secondary | ICD-10-CM | POA: Diagnosis not present

## 2021-08-16 DIAGNOSIS — S46022D Laceration of muscle(s) and tendon(s) of the rotator cuff of left shoulder, subsequent encounter: Secondary | ICD-10-CM | POA: Diagnosis not present

## 2021-08-18 DIAGNOSIS — N6002 Solitary cyst of left breast: Secondary | ICD-10-CM | POA: Diagnosis not present

## 2021-08-18 DIAGNOSIS — R928 Other abnormal and inconclusive findings on diagnostic imaging of breast: Secondary | ICD-10-CM | POA: Diagnosis not present

## 2021-08-31 DIAGNOSIS — E042 Nontoxic multinodular goiter: Secondary | ICD-10-CM | POA: Diagnosis not present

## 2021-08-31 DIAGNOSIS — I10 Essential (primary) hypertension: Secondary | ICD-10-CM | POA: Diagnosis not present

## 2021-08-31 DIAGNOSIS — E785 Hyperlipidemia, unspecified: Secondary | ICD-10-CM | POA: Diagnosis not present

## 2021-09-09 ENCOUNTER — Telehealth: Payer: Self-pay | Admitting: Interventional Cardiology

## 2021-09-09 DIAGNOSIS — R0602 Shortness of breath: Secondary | ICD-10-CM

## 2021-09-09 NOTE — Telephone Encounter (Signed)
Pt c/o Shortness Of Breath: STAT if SOB developed within the last 24 hours or pt is noticeably SOB on the phone  1. Are you currently SOB (can you hear that pt is SOB on the phone)? Not at this time  2. How long have you been experiencing SOB? Ff and on- gotten worse in the last 3 weekspati  3. Are you SOB when sitting or when up moving around? When moving around  4. Are you currently experiencing any other symptoms? Lightheaded sometimes, but not at this time, saw her primary doctor this week, told her to see Dr Tamala Julian- patient would like to be seen asap please

## 2021-09-09 NOTE — Telephone Encounter (Signed)
I spoke with patient. She reports she saw Dr Tamala Julian earlier this year for shortness of breath.  Cardiac CTA done at that time showed no evidence of CAD. Patient reports she has been having increased shortness of breath for last 2-3 weeks.  Also has episodes of heart beating fast.  Fast heart beats happened 1-2 times per week over the last 3 weeks.  Lasts about 5-10 minutes and returns to normal when she calms down and rests.   She does have swelling in her feet and legs at times. Was taking furosemide 20 mg daily but was told to decrease to as needed.  Patient reports swelling is better when she takes furosemide and it also seems to help with shortness of breath.  She saw PCP yesterday and was advised to contact Dr Tamala Julian. I advised patient to continue to take furosemide as needed and that message would be send to Dr Tamala Julian for recommendations.

## 2021-09-10 ENCOUNTER — Other Ambulatory Visit: Payer: Self-pay

## 2021-09-10 ENCOUNTER — Other Ambulatory Visit: Payer: Medicare Other | Admitting: *Deleted

## 2021-09-10 DIAGNOSIS — R0602 Shortness of breath: Secondary | ICD-10-CM | POA: Diagnosis not present

## 2021-09-10 NOTE — Telephone Encounter (Signed)
Annette Crome, MD to [name], RN   10:58 AM  She needs CXR and BMET/BNP today if possible and not done by PCP.Marland Kitchen She should use furosemide daily. Take 40 mg today then 20 mg daily thereafter if it helps her breathing. Weigh daily. Needs to be seen within 1 week and repeat BMET on diuretic therapy.    Spoke with pt and reviewed recommendations per Dr. Tamala Richardson.  Pt is on her way to the beauty shop so will not be able to get CXR done today.  She will try to get to our office before 5pm to get labs drawn.  Offered appt on Monday to see Dr. Tamala Richardson and she can't come because she will be in [place] at a funeral.  Scheduled her to see Dr. Johnsie Richardson on Tuesday for evaluation.  Pt aware to take her Furosemide everyday.

## 2021-09-10 NOTE — Progress Notes (Signed)
Cardiology Office Note:    Date:  09/10/2021   ID:  [name], DOB 19-Feb-1941, MRN [id number]  PCP:  Michael Boston, MD  Cardiologist: Tamala Julian  Referring MD: Michael Boston, MD   No chief complaint on file.   History of Present Illness:    [name] is a 80 y.o. female patient of Dr Tamala Julian added on to DOD schedule for more dyspnea and palpitations  Prior history of normal LV function with EF 60% 2010, hyperlipidemia, primary hypertension, obesity, history of rheumatic fever as a child, severe obstructive sleep apnea on CPAP,, former smoker, and palpitations. She is obese with severe OSA not always compliant with CPAP She is now on lasix. Has had bradycardia with coreg and verapamil in past   TTE 03/03/20 EF 60-65% grade one diastolic no pulmonary hypertension or sig valve dx Monitor 03/26/20 for palpitations NSR average HR 67 bpm rare PVCls no significant arrhythmias limited atrial tachycardia max 12 beats  Cardiac CTA 06/01/21 calcium score of 0 normal right dominant cors Dilated PA 31 mm suggesting pulmonary HTN   Dr Tamala Julian suggested BMET/BNP and CXR with lasix 40 mg then 20 mg daily   She describes more chronic exertional fatigue and dyspnea She has LE edema but not taking any lasix.     Past Medical History:  Diagnosis Date   Anemia    "years ago"   Arthritis    Asthma    Bradycardia    Cataract    "skin Cancer"   Chest pain    Nuclear, May, 2010, no scar or ischemia   Dyslipidemia    Ejection fraction    EF 60%, May, 2010, echo   GERD (gastroesophageal reflux disease)    Heart murmur    Hemorrhoids    Herpes simplex without mention of complication    History of skin cancer    HTN (hypertension)    Hx of colonic polyps    IBS (irritable bowel syndrome)    Overweight(278.02)    Pneumonia    Tachycardia    Rapid heart beat, October, 2011, 21 day monitor, sinus rhythm, patient senses mild increase in heart rate, low-dose beta blockade try    Past  Surgical History:  Procedure Laterality Date   APPENDECTOMY     CATARACT EXTRACTION Bilateral    OVARY SURGERY     PARTIAL HYSTERECTOMY     TONSILLECTOMY     vocal cord surgery     polyp removed    Current Medications: No outpatient medications have been marked as taking for the 09/14/21 encounter (Appointment) with Josue Hector, MD.     Allergies:   Methylphenidate derivatives   Social History   Socioeconomic History   Marital status: Divorced    Spouse name: Not on file   Number of children: 0   Years of education: Not on file   Highest education level: Not on file  Occupational History   Occupation: retired  Tobacco Use   Smoking status: Former    Packs/day: 1.00    Years: 30.00    Pack years: 30.00    Types: Cigarettes    Quit date: 10/10/1985    Years since quitting: 35.9   Smokeless tobacco: Never  Vaping Use   Vaping Use: Never used  Substance and Sexual Activity   Alcohol use: No   Drug use: No   Sexual activity: Not on file  Other Topics Concern   Not on file  Social History Narrative  Not on file   Social Determinants of Health   Financial Resource Strain: Not on file  Food Insecurity: Not on file  Transportation Needs: Not on file  Physical Activity: Not on file  Stress: Not on file  Social Connections: Not on file     Family History: The patient's family history includes Breast cancer in her cousin; Diabetes in her father; Hypertension in her father and mother; Ovarian cancer in her maternal grandmother; Stroke in her mother.  ROS:   Please see the history of present illness.    Some difficulty sleeping.  She occasionally falls asleep before she puts on CPAP.  She has glaucoma.  She uses eyedrops.  She is walking with a cane because of an injury to her left knee which was incurred when she fell down the escalator at [employer] earlier this year.  Ll other systems reviewed and are negative.  EKGs/Labs/Other Studies  Reviewed:    The following studies were reviewed today:  2D Doppler echocardiogram 03/03/2020: IMPRESSIONS   1. Normal LV systolic function; grade 1 diastolic dysfunction; mild LVH:  mild AI.   2. Left ventricular ejection fraction, by estimation, is 60 to 65%. The  left ventricle has normal function. The left ventricle has no regional  wall motion abnormalities. There is mild left ventricular hypertrophy.  Left ventricular diastolic parameters  are consistent with Grade I diastolic dysfunction (impaired relaxation).   3. Right ventricular systolic function is normal. The right ventricular  size is normal. There is normal pulmonary artery systolic pressure.   4. The mitral valve is normal in structure. Trivial mitral valve  regurgitation. No evidence of mitral stenosis.   5. The aortic valve is tricuspid. Aortic valve regurgitation is mild.  Mild aortic valve sclerosis is present, with no evidence of aortic valve  stenosis.   6. The inferior vena cava is normal in size with greater than 50%  respiratory variability, suggesting right atrial pressure of 3 mmHg.   7-day monitor performed 03/26/2020: Study Highlights  Sinus bradycardia, normal sinus rhythm and sinus tachycardia. The average heart rate was 67bpm and ranged from 42 to 126bpm. Occasional PVCs and ventricular couplets. PACs and nonsustained atrial tachcyardia up to 12 beats.   EKG:  09/14/2021 SR rate 68 isolated PVC no acute changes    Recent Labs: 05/25/2021: BUN 16; Creatinine, Ser 0.96; Potassium 4.7; Sodium 142  Recent Lipid Panel    Component Value Date/Time   CHOL  02/13/2009 0428    195        ATP III CLASSIFICATION:  <200     mg/dL   Desirable  200-239  mg/dL   Borderline High  >=240    mg/dL   High          TRIG 97 02/13/2009 0428   HDL 33 (L) 02/13/2009 0428   CHOLHDL 5.9 02/13/2009 0428   VLDL 19 02/13/2009 0428   LDLCALC (H) 02/13/2009 0428    143        Total Cholesterol/HDL:CHD Risk Coronary  Heart Disease Risk Table                     Men   Women  1/2 Average Risk   3.4   3.3  Average Risk       5.0   4.4  2 X Average Risk   9.6   7.1  3 X Average Risk  23.4   11.0  Use the calculated Patient Ratio above and the CHD Risk Table to determine the patient's CHD Risk.        ATP III CLASSIFICATION (LDL):  <100     mg/dL   Optimal  100-129  mg/dL   Near or Above                    Optimal  130-159  mg/dL   Borderline  160-189  mg/dL   High  >190     mg/dL   Very High    Physical Exam:    VS:  There were no vitals taken for this visit.    Wt Readings from Last 3 Encounters:  08/03/21 228 lb (103.4 kg)  05/18/21 234 lb 6.4 oz (106.3 kg)  07/30/20 235 lb (106.6 kg)     GEN: Morbidly obese. No acute distress HEENT: Normal NECK: No JVD. LYMPHATICS: No lymphadenopathy CARDIAC: Soft left parasternal to right upper sternal systolic murmur murmur.   VASCULAR:  Normal Pulses. No bruits. RESPIRATORY:  Clear to auscultation without rales, wheezing or rhonchi  ABDOMEN: Soft, non-tender, non-distended, No pulsatile mass, MUSCULOSKELETAL: No deformity  SKIN: Warm and dry Plus one bilateral LE edema    ASSESSMENT:    No diagnosis found.  PLAN:    In order of problems listed above:  Dyspnea:  this does not appear to be primarily cardiac. EF normal only grade one diastolic No CAD on CT Check BNP done Firday was normal as well Lasix 20 mg daily  Pulmonary:  f/u Dr Elsworth Soho for OSA stressed CPAP compliance consider PFTls for restrictive lung disease Dilated MPA on CT suggesting elevated PA pressures  HTN:  Well controlled.  Continue current medications and low sodium Dash type diet.   HLD:  continue statin  Murmur:  chronic ? From AV sclerosis    F/U Dr Tamala Julian next available   Time spent reviewing patients chart, echo cardiac CTA monitor pulmonary notes direct Patient interview exam and composing note 55 minutes   Signed, Jenkins Rouge, MD  09/10/2021 1:51 PM     [place]

## 2021-09-11 LAB — BASIC METABOLIC PANEL
BUN/Creatinine Ratio: 11 — ABNORMAL LOW (ref 12–28)
BUN: 12 mg/dL (ref 8–27)
CO2: 25 mmol/L (ref 20–29)
Calcium: 9.1 mg/dL (ref 8.7–10.3)
Chloride: 105 mmol/L (ref 96–106)
Creatinine, Ser: 1.08 mg/dL — ABNORMAL HIGH (ref 0.57–1.00)
Glucose: 99 mg/dL (ref 70–99)
Potassium: 4.5 mmol/L (ref 3.5–5.2)
Sodium: 143 mmol/L (ref 134–144)
eGFR: 53 mL/min/{1.73_m2} — ABNORMAL LOW (ref >59)

## 2021-09-11 LAB — PRO B NATRIURETIC PEPTIDE: NT-Pro BNP: 151 pg/mL (ref 0–738)

## 2021-09-13 ENCOUNTER — Encounter (HOSPITAL_COMMUNITY): Payer: Medicare Other

## 2021-09-14 ENCOUNTER — Ambulatory Visit
Admission: RE | Admit: 2021-09-14 | Discharge: 2021-09-14 | Disposition: A | Discharge status: Home / Self Care | Payer: Medicare Other | Source: Ambulatory Visit | Attending: Cardiovascular Disease | Admitting: Cardiovascular Disease

## 2021-09-14 ENCOUNTER — Encounter: Payer: Self-pay | Admitting: Cardiovascular Disease

## 2021-09-14 ENCOUNTER — Ambulatory Visit (INDEPENDENT_AMBULATORY_CARE_PROVIDER_SITE_OTHER): Payer: Medicare Other | Admitting: Cardiovascular Disease

## 2021-09-14 ENCOUNTER — Other Ambulatory Visit: Payer: Self-pay

## 2021-09-14 VITALS — BP 126/56 | HR 68 | Ht 62.5 in | Wt 229.0 lb

## 2021-09-14 DIAGNOSIS — R0609 Other forms of dyspnea: Secondary | ICD-10-CM

## 2021-09-14 DIAGNOSIS — I5032 Chronic diastolic (congestive) heart failure: Secondary | ICD-10-CM

## 2021-09-14 DIAGNOSIS — R609 Edema, unspecified: Secondary | ICD-10-CM

## 2021-09-14 DIAGNOSIS — G4733 Obstructive sleep apnea (adult) (pediatric): Secondary | ICD-10-CM

## 2021-09-14 DIAGNOSIS — R600 Localized edema: Secondary | ICD-10-CM

## 2021-09-14 MED ORDER — FUROSEMIDE 20 MG PO TABS: 20.0000 mg | ORAL_TABLET | Freq: Every day | ORAL | 3 refills | Status: DC

## 2021-09-14 NOTE — Patient Instructions (Addendum)
Medication Instructions:  Your physician has recommended you make the following change in your medication:  Take Furosemide 20 mg by mouth daily   *If you need a refill on your cardiac medications before your next appointment, please call your pharmacy*   Lab Work: NONE If you have labs (blood work) drawn today and your tests are completely normal, you will receive your results only by: [place] (if you have MyChart) OR A paper copy in the mail If you have any lab test that is abnormal or we need to change your treatment, we will call you to review the results.   Testing/Procedures: A chest x-ray takes a picture of the organs and structures inside the chest, including the heart, lungs, and blood vessels. This test can show several things, including, whether the heart is enlarges; whether fluid is building up in the lungs; and whether pacemaker / defibrillator leads are still in place.   Chest X-ray Instructions:    1. You may have this done at the [facility], located in the [place] on the 1st floor.    2. You do no have to have an appointment.    3. [place], [place] [address]        [phone]        Monday - Friday  8:00 am - 5:00 pm   Follow-Up: At [facility], you and your health needs are our priority.  As part of our continuing mission to provide you with exceptional heart care, we have created designated Provider Care Teams.  These Care Teams include your primary Cardiologist (physician) and Advanced Practice Providers (APPs -  Physician Assistants and Nurse Practitioners) who all work together to provide you with the care you need, when you need it.  We recommend signing up for the patient portal called "MyChart".  Sign up information is provided on this After Visit Summary.  MyChart is used to connect with patients for Virtual Visits (Telemedicine).  Patients are able to view lab/test results, encounter notes,  upcoming appointments, etc.  Non-urgent messages can be sent to your provider as well.   To learn more about what you can do with MyChart, go to [email or web address].    Your next appointment:   3-6 weeks  The format for your next appointment:   In Person  Provider:   Daneen Schick, MD or Ermalinda Barrios, PA-C or Richardson Dopp, PA-C

## 2021-09-15 ENCOUNTER — Ambulatory Visit (HOSPITAL_COMMUNITY)
Admission: RE | Admit: 2021-09-15 | Discharge: 2021-09-15 | Disposition: A | Discharge status: Home / Self Care | Payer: Medicare Other | Source: Ambulatory Visit | Attending: Internal Medicine | Admitting: Internal Medicine

## 2021-09-15 ENCOUNTER — Other Ambulatory Visit (HOSPITAL_COMMUNITY): Payer: Self-pay | Admitting: Internal Medicine

## 2021-09-15 DIAGNOSIS — I6529 Occlusion and stenosis of unspecified carotid artery: Secondary | ICD-10-CM

## 2022-05-28 ENCOUNTER — Ambulatory Visit
Admission: EM | Admit: 2022-05-28 | Discharge: 2022-05-28 | Disposition: A | Discharge status: Home / Self Care | Payer: Medicare PPO | Attending: Physician Assistant | Admitting: Physician Assistant

## 2022-05-28 DIAGNOSIS — R051 Acute cough: Secondary | ICD-10-CM

## 2022-05-28 DIAGNOSIS — J019 Acute sinusitis, unspecified: Secondary | ICD-10-CM | POA: Diagnosis not present

## 2022-05-28 MED ORDER — ACETAMINOPHEN 325 MG PO TABS
975.0000 mg | ORAL_TABLET | Freq: Once | ORAL | Status: AC
  Administered 2022-05-28: 975 mg via ORAL

## 2022-05-28 MED ORDER — AMOXICILLIN-POT CLAVULANATE 875-125 MG PO TABS: 1.0000 | ORAL_TABLET | Freq: Two times a day (BID) | ORAL | 0 refills | Status: DC

## 2022-05-28 MED ORDER — BENZONATATE 100 MG PO CAPS: 100.0000 mg | ORAL_CAPSULE | Freq: Three times a day (TID) | ORAL | 0 refills | Status: DC

## 2022-05-28 NOTE — Discharge Instructions (Addendum)
Take antibiotic as prescribed Can take tessalon pearls as needed for cough Recommend Flonase and Zyrtec Can take Robitussin as needed for cough Drink plenty of fluids, rest Return if symptoms become worse or follow up with primary care physician

## 2022-05-28 NOTE — ED Provider Notes (Signed)
[facility]    CSN: [id number] Arrival date & time: 05/28/22  1421      History   Chief Complaint Chief Complaint  Patient presents with   URI   Otalgia   Generalized Body Aches    HPI [name] is a 81 y.o. female.   Pt complains of headache, cough, nasal congestion, facial pain and pressure that started a little over one week ago.  Reports fever today.  She denies wheezing, trouble breathing, n/v/d.  She has taken otc cold and cough medications with minimal relief.      Past Medical History:  Diagnosis Date   Anemia    "years ago"   Arthritis    Asthma    Bradycardia    Cataract    "skin Cancer"   Chest pain    Nuclear, May, 2010, no scar or ischemia   Dyslipidemia    Ejection fraction    EF 60%, May, 2010, echo   GERD (gastroesophageal reflux disease)    Heart murmur    Hemorrhoids    Herpes simplex without mention of complication    History of skin cancer    HTN (hypertension)    Hx of colonic polyps    IBS (irritable bowel syndrome)    Overweight(278.02)    Pneumonia    Tachycardia    Rapid heart beat, October, 2011, 21 day monitor, sinus rhythm, patient senses mild increase in heart rate, low-dose beta blockade try    Patient Active Problem List   Diagnosis Date Noted   Healthcare maintenance 07/30/2020   OSA (obstructive sleep apnea) 03/23/2020   Sleep-wake cycle disorder 03/23/2020   HTN (hypertension) 12/27/2011   Mycosis fungoides ([place]) 12/27/2011   Urinary incontinence 12/27/2011   Chest pain    Ejection fraction    Tachycardia    Dyslipidemia    PALPITATIONS 07/22/2010   DYSLIPIDEMIA 02/17/2009   OBESITY, MORBID 02/17/2009   BRADYCARDIA 02/17/2009   SKIN CANCER, HX OF 02/17/2009   HERPES SIMPLEX INFECTION, TYPE I 09/17/2008   HEMORRHOIDS 09/17/2008   IBS 09/17/2008   COLONIC POLYPS, HX OF 09/17/2008    Past Surgical History:  Procedure Laterality Date   APPENDECTOMY     CATARACT EXTRACTION Bilateral     OVARY SURGERY     PARTIAL HYSTERECTOMY     TONSILLECTOMY     vocal cord surgery     polyp removed    OB History   No obstetric history on file.      Home Medications    Prior to Admission medications   Medication Sig Start Date End Date Taking? Authorizing Provider  amoxicillin-clavulanate (AUGMENTIN) 875-125 MG tablet Take 1 tablet by mouth every 12 (twelve) hours. 05/28/22  Yes Ward, Lenise Arena, PA-C  benzonatate (TESSALON) 100 MG capsule Take 1 capsule (100 mg total) by mouth every 8 (eight) hours. 05/28/22  Yes Ward, Lenise Arena, PA-C  aspirin EC 81 MG tablet Take 81 mg by mouth daily.    [provider]  atorvastatin (LIPITOR) 20 MG tablet Take 20 mg by mouth daily.    [provider]  carbamide peroxide (DEBROX) 6.5 % OTIC solution Place 5 drops into both ears 2 (two) times daily. Patient not taking: Reported on 05/18/2021 04/30/21   Chase Picket, MD  carvedilol (COREG) 6.25 MG tablet Take 6.25 mg by mouth as needed. Patient not taking: Reported on 08/03/2021 05/21/19   [provider]  dorzolamide-timolol (COSOPT) 22.3-6.8 MG/ML ophthalmic solution Place 1 drop  into the right eye 2 (two) times daily. 05/02/21   [provider]  furosemide (LASIX) 20 MG tablet Take 1 tablet (20 mg total) by mouth daily. 09/14/21   Josue Hector, MD  meloxicam ([place]) 15 MG tablet TAKE 1 TABLET BY MOUTH EVERY DAY Patient not taking: Reported on 09/14/2021 06/27/19   Gardiner Barefoot, DPM  metoprolol tartrate (LOPRESSOR) 100 MG tablet Take one tablet by mouth 2 hours prior to your CT Patient not taking: Reported on 08/03/2021 05/18/21   Belva Crome, MD  Neomycin-Bacitracin Zn-Polymyx (NEO-POLYCIN OP) neomycin-bacitracin-polymyxin Patient not taking: Reported on 08/03/2021    [provider]  neomycin-polymyxin-hydrocortisone (CORTISPORIN) 3.5-10000-1 OTIC suspension Place 4 drops into both ears 3 (three) times daily. Patient not taking: Reported on  08/03/2021 05/05/21   Hughie Closs, PA-C  pantoprazole (PROTONIX) 40 MG tablet Take 40 mg by mouth daily. Patient not taking: Reported on 08/03/2021 05/25/20   [provider]  polyethylene glycol powder (GLYCOLAX/MIRALAX) powder Take 17 g by mouth as needed. Patient not taking: Reported on 08/03/2021    [provider]  triamcinolone cream (KENALOG) 0.1 %     [provider]  UNABLE TO FIND triamcinolone-dimeth-silicone    [provider]  valsartan (DIOVAN) 320 MG tablet Take 320 mg by mouth daily.    [provider]  verapamil (CALAN-SR) 240 MG CR tablet Take 240 mg by mouth daily. 11/15/17   [provider]  Vibegron 75 MG TABS Take 1 tablet by mouth daily. Patient not taking: Reported on 08/03/2021    [provider]    Family History Family History  Problem Relation Age of Onset   Stroke Mother    Hypertension Mother    Diabetes Father    Hypertension Father    Ovarian cancer Maternal Grandmother    Breast cancer Cousin        x 4    Social History Social History   Tobacco Use   Smoking status: Former    Packs/day: 1.00    Years: 30.00    Total pack years: 30.00    Types: Cigarettes    Quit date: 10/10/1985    Years since quitting: 36.6   Smokeless tobacco: Never  Vaping Use   Vaping Use: Never used  Substance Use Topics   Alcohol use: No   Drug use: No     Allergies   Methylphenidate derivatives   Review of Systems Review of Systems  Constitutional:  Negative for chills and fever.  HENT:  Positive for congestion, sinus pressure and sinus pain. Negative for ear pain and sore throat.   Eyes:  Negative for pain and visual disturbance.  Respiratory:  Positive for cough. Negative for shortness of breath.   Cardiovascular:  Negative for chest pain and palpitations.  Gastrointestinal:  Negative for abdominal pain and vomiting.  Genitourinary:  Negative for dysuria and hematuria.  Musculoskeletal:   Negative for arthralgias and back pain.  Skin:  Negative for color change and rash.  Neurological:  Negative for seizures and syncope.  All other systems reviewed and are negative.    Physical Exam Triage Vital Signs ED Triage Vitals  Enc Vitals Group     BP 05/28/22 1447 120/69     Pulse Rate 05/28/22 1447 81     Resp 05/28/22 1447 17     Temp 05/28/22 1447 (!) 101.4 F (38.6 C)     Temp Source 05/28/22 1447 Oral     SpO2 05/28/22 1447  98 %     Weight --      Height --      Head Circumference --      Peak Flow --      Pain Score 05/28/22 1446 6     Pain Loc --      Pain Edu? --      Excl. in [place]? --    No data found.  Updated Vital Signs BP 120/69 (BP Location: Left Arm)   Pulse 81   Temp (!) 101.4 F (38.6 C) (Oral)   Resp 17   SpO2 98%   Visual Acuity Right Eye Distance:   Left Eye Distance:   Bilateral Distance:    Right Eye Near:   Left Eye Near:    Bilateral Near:     Physical Exam Vitals and nursing note reviewed.  Constitutional:      General: She is not in acute distress.    Appearance: She is well-developed.  HENT:     Head: Normocephalic and atraumatic.  Eyes:     Conjunctiva/sclera: Conjunctivae normal.  Cardiovascular:     Rate and Rhythm: Normal rate and regular rhythm.     Heart sounds: No murmur heard. Pulmonary:     Effort: Pulmonary effort is normal. No respiratory distress.     Breath sounds: Normal breath sounds.  Abdominal:     Palpations: Abdomen is soft.     Tenderness: There is no abdominal tenderness.  Musculoskeletal:        General: No swelling.     Cervical back: Neck supple.  Skin:    General: Skin is warm and dry.     Capillary Refill: Capillary refill takes less than 2 seconds.  Neurological:     Mental Status: She is alert.  Psychiatric:        Mood and Affect: Mood normal.      UC Treatments / Results  Labs (all labs ordered are listed, but only abnormal results are displayed) Labs Reviewed - No data to  display  EKG   Radiology No results found.  Procedures Procedures (including critical care time)  Medications Ordered in UC Medications  acetaminophen (TYLENOL) tablet 975 mg (975 mg Oral Given 05/28/22 1454)    Initial Impression / Assessment and Plan / UC Course  I have reviewed the triage vital signs and the nursing notes.  Pertinent labs & imaging results that were available during my care of the patient were reviewed by me and considered in my medical decision making (see chart for details).     Acute sinusitis with cough.  Antibiotic prescribed. Lungs are clear, stable for discharge.  Antibiotic prescribed.  Tessalon pearls prescribed.  Return precautions given.  Final Clinical Impressions(s) / UC Diagnoses   Final diagnoses:  Acute non-recurrent sinusitis, unspecified location  Acute cough     Discharge Instructions      Take antibiotic as prescribed Can take tessalon pearls as needed for cough Recommend Flonase and Zyrtec Can take Robitussin as needed for cough Drink plenty of fluids, rest Return if symptoms become worse or follow up with primary care physician   ED Prescriptions     Medication Sig Dispense Auth. Provider   amoxicillin-clavulanate (AUGMENTIN) 875-125 MG tablet Take 1 tablet by mouth every 12 (twelve) hours. 14 tablet Ward, Janett Billow Z, PA-C   benzonatate (TESSALON) 100 MG capsule Take 1 capsule (100 mg total) by mouth every 8 (eight) hours. 21 capsule Ward, Lenise Arena, PA-C  PDMP not reviewed this encounter.   Ward, Lenise Arena, PA-C 05/28/22 1525

## 2022-05-28 NOTE — ED Triage Notes (Signed)
Pt presents with headache,  cough, nasal drainage, chills, bilateral ear pain, generalized body aches, and congestion X 1 week.

## 2022-08-02 ENCOUNTER — Ambulatory Visit: Payer: Medicare Other | Admitting: Pulmonary Disease

## 2022-08-03 ENCOUNTER — Ambulatory Visit: Payer: Medicare PPO | Admitting: Pulmonary Disease

## 2022-08-03 ENCOUNTER — Encounter: Payer: Self-pay | Admitting: Pulmonary Disease

## 2022-08-03 VITALS — BP 134/66 | HR 73 | Ht 62.5 in | Wt 228.0 lb

## 2022-08-03 DIAGNOSIS — G4733 Obstructive sleep apnea (adult) (pediatric): Secondary | ICD-10-CM

## 2022-08-03 DIAGNOSIS — Z23 Encounter for immunization: Secondary | ICD-10-CM | POA: Diagnosis not present

## 2022-08-03 DIAGNOSIS — G4720 Circadian rhythm sleep disorder, unspecified type: Secondary | ICD-10-CM

## 2022-08-03 NOTE — Progress Notes (Signed)
   Subjective:    Patient ID: [name], female    DOB: 1941/04/26, 81 y.o.   MRN: [id number]  HPI  81 yo for follow-up of OSA and circadian rhythm disorder, freerunning type  Annual follow-up. She is retired since 2004, wakes up every night at 3 AM, and stays in bed up to 10 AM.  Does not have fixed bedtime Denies any problems with mask or pressure She uses nasal pillows Blood pressures controlled on 2 medications  Significant tests/ events reviewed  04/2020 HST showed severe OSA >> AHI 32/h  Review of Systems neg for any significant sore throat, dysphagia, itching, sneezing, nasal congestion or excess/ purulent secretions, fever, chills, sweats, unintended wt loss, pleuritic or exertional cp, hempoptysis, orthopnea pnd or change in chronic leg swelling. Also denies presyncope, palpitations, heartburn, abdominal pain, nausea, vomiting, diarrhea or change in bowel or urinary habits, dysuria,hematuria, rash, arthralgias, visual complaints, headache, numbness weakness or ataxia.     Objective:   Physical Exam  Gen. Pleasant, obese, in no distress ENT - no lesions, no post nasal drip Neck: No JVD, no thyromegaly, no carotid bruits Lungs: no use of accessory muscles, no dullness to percussion, decreased without rales or rhonchi  Cardiovascular: Rhythm regular, heart sounds  normal, no murmurs or gallops, no peripheral edema Musculoskeletal: No deformities, no cyanosis or clubbing , no tremors       Assessment & Plan:

## 2022-08-03 NOTE — Patient Instructions (Signed)
X flu shot  CPAP is working well Sunlight exposure at 10 am x 30 mins

## 2022-08-03 NOTE — Assessment & Plan Note (Signed)
CPAP download was reviewed which shows excellent control of events on auto settings 5 to 15 cm with average pressure 14.6 cm.  She is very compliant about 6 hours per night and CPAP is only helped improve her daytime somnolence and fatigue. CPAP supplies will be renewed for a year  Weight loss encouraged, compliance with goal of at least 4-6 hrs every night is the expectation. Advised against medications with sedative side effects Cautioned against driving when sleepy - understanding that sleepiness will vary on a day to day basis

## 2022-08-03 NOTE — Assessment & Plan Note (Signed)
She does have a circadian disorder, freerunning type. We discussed sunlight exposure for 30 minutes in the morning Can also consider increasing her bedtime with melatonin does not seem necessary

## 2022-08-04 ENCOUNTER — Ambulatory Visit: Payer: Medicare Other | Admitting: Pulmonary Disease

## 2023-03-27 ENCOUNTER — Other Ambulatory Visit (HOSPITAL_COMMUNITY): Payer: Self-pay | Admitting: Internal Medicine

## 2023-03-27 DIAGNOSIS — R011 Cardiac murmur, unspecified: Secondary | ICD-10-CM

## 2023-04-17 ENCOUNTER — Ambulatory Visit (HOSPITAL_COMMUNITY)
Admission: RE | Admit: 2023-04-17 | Discharge: 2023-04-17 | Disposition: A | Discharge status: Home / Self Care | Payer: Medicare PPO | Source: Ambulatory Visit | Attending: Internal Medicine | Admitting: Internal Medicine

## 2023-04-17 DIAGNOSIS — R011 Cardiac murmur, unspecified: Secondary | ICD-10-CM

## 2023-04-17 DIAGNOSIS — R001 Bradycardia, unspecified: Secondary | ICD-10-CM | POA: Insufficient documentation

## 2023-04-17 DIAGNOSIS — R000 Tachycardia, unspecified: Secondary | ICD-10-CM | POA: Insufficient documentation

## 2023-04-17 DIAGNOSIS — I083 Combined rheumatic disorders of mitral, aortic and tricuspid valves: Secondary | ICD-10-CM | POA: Diagnosis present

## 2023-04-17 DIAGNOSIS — R079 Chest pain, unspecified: Secondary | ICD-10-CM | POA: Diagnosis not present

## 2023-04-17 DIAGNOSIS — I10 Essential (primary) hypertension: Secondary | ICD-10-CM | POA: Insufficient documentation

## 2023-04-17 LAB — ECHOCARDIOGRAM COMPLETE
AV Vena cont: 0.2 cm
Area-P 1/2: 3.78 cm2
Calc EF: 67.1 %
P 1/2 time: 379 msec
S' Lateral: 2.8 cm
Single Plane A2C EF: 68 %
Single Plane A4C EF: 67.7 %

## 2023-04-17 NOTE — Progress Notes (Signed)
Echocardiogram 2D Echocardiogram has been performed.  Augustine Radar 04/17/2023, 11:48 AM

## 2023-05-18 NOTE — Progress Notes (Signed)
Office Visit    Patient Name: [name] Date of Encounter: 05/18/2023  Primary Care Provider:  Melida Quitter, MD Primary Cardiologist:  None Primary Electrophysiologist: None   Past Medical History    Past Medical History:  Diagnosis Date   Anemia    "years ago"   Arthritis    Asthma    Bradycardia    Cataract    "skin Cancer"   Chest pain    Nuclear, May, 2010, no scar or ischemia   Dyslipidemia    Ejection fraction    EF 60%, May, 2010, echo   GERD (gastroesophageal reflux disease)    Heart murmur    Hemorrhoids    Herpes simplex without mention of complication    History of skin cancer    HTN (hypertension)    Hx of colonic polyps    IBS (irritable bowel syndrome)    Overweight(278.02)    Pneumonia    Tachycardia    Rapid heart beat, October, 2011, 21 day monitor, sinus rhythm, patient senses mild increase in heart rate, low-dose beta blockade try   Past Surgical History:  Procedure Laterality Date   APPENDECTOMY     CATARACT EXTRACTION Bilateral    OVARY SURGERY     PARTIAL HYSTERECTOMY     TONSILLECTOMY     vocal cord surgery     polyp removed    Allergies  Allergies  Allergen Reactions   Methylphenidate Derivatives     Makes breast enlarged     History of Present Illness    [name]  is a 82 year old female with a PMH of essential hypertension, OSA (on CPAP), palpitations, shortness of breath, history of bradycardia on beta-blockers who presents today for follow-up.  He was originally was seen initially by Dr. Myrtis Ser and has been followed by Dr. Katrinka Blazing and Dr. Eden Emms most recently.  05/2021 patient presented with complaint of shortness of breath and Dr. Katrinka Blazing completed a coronary CTA that showed aortic atherosclerosis with no evidence of CAD and dilated pulmonary arteries suggestive of pulmonary HTN.  She continued to have complaints of shortness of breath and was seen by Dr. Eden Emms on 09/14/2021.  2D echo completed 03/03/2020  showed no significant valve disease and EF of 60 to 65%.  She was started on Lasix 20 mg daily but was not taking daily.  Patient was advised to follow-up with pulmonology and blood pressures were noted to be well-controlled. 2D echo completed 04/17/2023 with EF of 60 to 65% and grade 1 DD with mild MVR and moderate TVR with mild to moderate AVR and thickening of the leaflets and no evidence of aortic stenosis.  Since last being seen in the office patient reports she has been experiencing increased shortness of breath with ambulation and sometimes at rest.  Her blood pressure today is controlled at 130/58 and heart rate is 70 bpm.  She reports compliance with her current medication regimen and denies any adverse reactions.  She is currently euvolemic on exam does experience some orthopnea usually sleeps on her side at night.  She has Lasix as needed however she reports not taking them currently due to leaky bladder syndrome.  We discussed possibility of additional pathophysiology that may be contributing to her fatigue and shortness of breath.  She is currently followed by pulmonology for sleep apnea and is scheduled to be seen for pulmonary hypertension.  Patient denies chest pain, palpitations, dyspnea, PND, orthopnea, nausea, vomiting, dizziness, syncope, edema, weight  gain, or early satiety.   Home Medications    Current Outpatient Medications  Medication Sig Dispense Refill   aspirin EC 81 MG tablet Take 81 mg by mouth daily.     atorvastatin (LIPITOR) 20 MG tablet Take 20 mg by mouth daily.     carboxymethylcellulose (REFRESH PLUS) 0.5 % SOLN 1 drop as directed.     cyanocobalamin 1000 MCG tablet Take 1,000 mcg by mouth daily.     dorzolamide-timolol (COSOPT) 22.3-6.8 MG/ML ophthalmic solution Place 1 drop into the right eye 2 (two) times daily.     furosemide (LASIX) 20 MG tablet Take 1 tablet (20 mg total) by mouth daily. 90 tablet 3   meloxicam (MOBIC) 15 MG tablet TAKE 1 TABLET BY MOUTH EVERY  DAY 30 tablet 0   polyethylene glycol powder (GLYCOLAX/MIRALAX) powder Take 17 g by mouth as needed.     triamcinolone cream (KENALOG) 0.1 %      UNABLE TO FIND triamcinolone-dimeth-silicone     valsartan (DIOVAN) 320 MG tablet Take 320 mg by mouth daily.     verapamil (CALAN-SR) 240 MG CR tablet Take 240 mg by mouth daily.  5   Vibegron 75 MG TABS Take 1 tablet by mouth daily.     No current facility-administered medications for this visit.     Review of Systems  Please see the history of present illness.    (+) Fatigue (+) Shortness of breath  All other systems reviewed and are otherwise negative except as noted above.  Physical Exam    Wt Readings from Last 3 Encounters:  08/03/22 228 lb (103.4 kg)  09/14/21 229 lb (103.9 kg)  08/03/21 228 lb (103.4 kg)   GN:FAOZH were no vitals filed for this visit.,There is no height or weight on file to calculate BMI.  Constitutional:      Appearance: Healthy appearance. Not in distress.  Neck:     Vascular: JVD normal.  Pulmonary:     Effort: Pulmonary effort is normal.     Breath sounds: No wheezing. No rales. Diminished in the bases Cardiovascular:     Normal rate. Regular rhythm. Normal S1. Normal S2.      Murmurs: There is no murmur.  Edema:    Peripheral edema absent.  Abdominal:     Palpations: Abdomen is soft non tender. There is no hepatomegaly.  Skin:    General: Skin is warm and dry.  Neurological:     General: No focal deficit present.     Mental Status: Alert and oriented to person, place and time.     Cranial Nerves: Cranial nerves are intact.  EKG/LABS/ Recent Cardiac Studies    ECG personally reviewed by me today -sinus rhythm with RBBB and left anterior fascicular block with rate of 70 bpm and no acute changes      Lab Results  Component Value Date   WBC 10.3 04/25/2019   HGB 12.3 04/25/2019   HCT 37.9 04/25/2019   MCV 95.7 04/25/2019   PLT 190 04/25/2019   Lab Results  Component Value Date    CREATININE 1.08 (H) 09/10/2021   BUN 12 09/10/2021   NA 143 09/10/2021   K 4.5 09/10/2021   CL 105 09/10/2021   CO2 25 09/10/2021   Lab Results  Component Value Date   ALT 11 (L) 11/10/2015   AST 16 11/10/2015   ALKPHOS 78 11/10/2015   BILITOT 0.5 11/10/2015   Lab Results  Component Value Date   CHOL  02/13/2009  195        ATP III CLASSIFICATION:  <200     mg/dL   Desirable  161-096  mg/dL   Borderline High  >=045    mg/dL   High          HDL 33 (L) 02/13/2009   LDLCALC (H) 02/13/2009    143        Total Cholesterol/HDL:CHD Risk Coronary Heart Disease Risk Table                     Men   Women  1/2 Average Risk   3.4   3.3  Average Risk       5.0   4.4  2 X Average Risk   9.6   7.1  3 X Average Risk  23.4   11.0        Use the calculated Patient Ratio above and the CHD Risk Table to determine the patient's CHD Risk.        ATP III CLASSIFICATION (LDL):  <100     mg/dL   Optimal  409-811  mg/dL   Near or Above                    Optimal  130-159  mg/dL   Borderline  914-782  mg/dL   High  >956     mg/dL   Very High   TRIG 97 21/30/8657   CHOLHDL 5.9 02/13/2009    No results found for: "HGBA1C"   Assessment & Plan    1.  Essential hypertension: -Patient's blood pressure today was controlled at 130/58 -Continue valsartan 320 mg and verapamil 240 mg  2.  Hyperlipidemia: -Patient's LDL cholesterol was 76 -Continue Lipitor 20 mg daily  3.  HFpEF/shortness of breath: -2D echo completed 04/17/2023 with EF of 60 to 65% and grade 1 DD with mild MVR and moderate TVR with mild to moderate AVR and thickening of the leaflets and no evidence of aortic stenosis. -Patient reports ongoing shortness of breath with fatigue especially with ambulation. -She denies any associated dizziness or palpitations with current symptoms. -We will have her take Lasix 20 mg twice daily x 2 days and then 20 mg as needed. -BMET in 1 week and patient was advised to increase consumption  of foods rich in potassium. -Low sodium diet, fluid restriction <2L, and daily weights encouraged. Educated to contact our office for weight gain of 2 lbs overnight or 5 lbs in one week.   4.  Pulmonary HTN: -Patient is currently being followed by pulmonology -2D echo was completed 04/17/2023 showing TV regurgitation and moderately elevated pulmonary artery systolic pressure -Continue diuretic as noted above  5.  Morbid obesity: -Patient's BMI is 42.19 kg/m -Continue low-sodium heart healthy diet   Disposition: Follow-up with None or APP in 2 months    Medication Adjustments/Labs and Tests Ordered: Current medicines are reviewed at length with the patient today.  Concerns regarding medicines are outlined above.   Signed, Napoleon Form, Leodis Rains, NP 05/18/2023, 9:48 AM [facility]

## 2023-05-19 ENCOUNTER — Ambulatory Visit: Payer: Medicare PPO | Attending: Nurse Practitioner | Admitting: Nurse Practitioner

## 2023-05-19 ENCOUNTER — Encounter: Payer: Self-pay | Admitting: Nurse Practitioner

## 2023-05-19 VITALS — BP 130/58 | HR 70 | Ht 62.5 in | Wt 234.4 lb

## 2023-05-19 DIAGNOSIS — G4733 Obstructive sleep apnea (adult) (pediatric): Secondary | ICD-10-CM

## 2023-05-19 DIAGNOSIS — I10 Essential (primary) hypertension: Secondary | ICD-10-CM

## 2023-05-19 DIAGNOSIS — I272 Pulmonary hypertension, unspecified: Secondary | ICD-10-CM | POA: Diagnosis not present

## 2023-05-19 DIAGNOSIS — E785 Hyperlipidemia, unspecified: Secondary | ICD-10-CM | POA: Diagnosis not present

## 2023-05-19 NOTE — Patient Instructions (Signed)
Medication Instructions:  TAKE Lasix 20mg  take 1 tablet twice a day for 2 days then go back to as needed *If you need a refill on your cardiac medications before your next appointment, please call your pharmacy*   Lab Work: 1 WEEK BMET If you have labs (blood work) drawn today and your tests are completely normal, you will receive your results only by: MyChart Message (if you have MyChart) OR A paper copy in the mail If you have any lab test that is abnormal or we need to change your treatment, we will call you to review the results.   Testing/Procedures: None ordered   Follow-Up: At [facility], you and your health needs are our priority.  As part of our continuing mission to provide you with exceptional heart care, we have created designated Provider Care Teams.  These Care Teams include your primary Cardiologist (physician) and Advanced Practice Providers (APPs -  Physician Assistants and Nurse Practitioners) who all work together to provide you with the care you need, when you need it.  We recommend signing up for the patient portal called "MyChart".  Sign up information is provided on this After Visit Summary.  MyChart is used to connect with patients for Virtual Visits (Telemedicine).  Patients are able to view lab/test results, encounter notes, upcoming appointments, etc.  Non-urgent messages can be sent to your provider as well.   To learn more about what you can do with MyChart, go to [email or web address].    Your next appointment:   2 month(s)  Provider:   Robin Searing, NP    Other Instructions  CHECK YOUR WEIGHT DAILY

## 2023-05-24 ENCOUNTER — Encounter (HOSPITAL_BASED_OUTPATIENT_CLINIC_OR_DEPARTMENT_OTHER): Payer: Self-pay | Admitting: Pulmonary Disease

## 2023-05-24 ENCOUNTER — Ambulatory Visit (HOSPITAL_BASED_OUTPATIENT_CLINIC_OR_DEPARTMENT_OTHER): Payer: Medicare PPO | Admitting: Pulmonary Disease

## 2023-05-24 VITALS — BP 138/64 | HR 74 | Ht 62.5 in | Wt 230.0 lb

## 2023-05-24 DIAGNOSIS — I2729 Other secondary pulmonary hypertension: Secondary | ICD-10-CM | POA: Diagnosis not present

## 2023-05-24 DIAGNOSIS — G4733 Obstructive sleep apnea (adult) (pediatric): Secondary | ICD-10-CM | POA: Diagnosis not present

## 2023-05-24 NOTE — Assessment & Plan Note (Signed)
This is a new finding compared to previous echo in 2021 which showed normal pulmonary artery pressure.  She is a remote smoker less than 15 pack years I doubt significant COPD.  She has no risk factors for or prior history of VTE but we will obtain CT angiogram chest to rule out CTEPH.  This is likely WHO 2 related to cardiac disease and she will likely need right heart cath.  There is no evidence of coronary artery calcification and she may not need left heart cath.  Will also refer her to advanced heart failure service for right heart cath.  Her OSA is fully treated and doubt is contributing much to the pulm hypertension

## 2023-05-24 NOTE — Progress Notes (Signed)
   Subjective:    Patient ID: [name], female    DOB: 12/12/1940, 82 y.o.   MRN: [id number]  HPI 82 yo for follow-up of OSA and circadian rhythm disorder, freerunning type   Last seen 07/2022 for OSA. She is now referred for evaluation of new finding of pulmonary hypertension. She complained of dyspnea on exertion ongoing for a few months.  She can only walk about 50 yards, occasionally short of breath on routine activities at home. She reports occasional leg swelling for which she takes furosemide on an as-needed basis.  She was seen by cardiology APP, Lasix was increased and she lost 3 pounds. She smoked half pack per day until she quit in 1990, about 12 pack years  On ambulation oxygen saturation dropped from 98 to 94% and heart rate increased from 84-1 1 2   She denies any problems with mask or pressure and is generally compliant with CPAP machine   Significant tests/ events reviewed   Echo 04/17/2023 with EF of 60 to 65%, grade 1 DD with mild MVR and moderate TVR with mild to moderate AVR , RVSP 48  04/2020 HST showed severe OSA >> AHI 32/h  Review of Systems neg for any significant sore throat, dysphagia, itching, sneezing, nasal congestion or excess/ purulent secretions, fever, chills, sweats, unintended wt loss, pleuritic or exertional cp, hempoptysis, orthopnea pnd or change in chronic leg swelling. Also denies presyncope, palpitations, heartburn, abdominal pain, nausea, vomiting, diarrhea or change in bowel or urinary habits, dysuria,hematuria, rash, arthralgias, visual complaints, headache, numbness weakness or ataxia.     Objective:   Physical Exam  Gen. Pleasant, obese, in no distress ENT - no lesions, no post nasal drip Neck: No JVD, no thyromegaly, no carotid bruits Lungs: no use of accessory muscles, no dullness to percussion, decreased without rales or rhonchi  Cardiovascular: Rhythm regular, heart sounds  normal, no murmurs or gallops, 1+ peripheral  edema Musculoskeletal: No deformities, no cyanosis or clubbing , no tremors       Assessment & Plan:

## 2023-05-24 NOTE — Assessment & Plan Note (Signed)
CPAP download was reviewed which shows good control of events on auto settings 5 to 15 cm with average pressure of 14.5 cm.  Average usage is about 5 hours per night with only 1 missed night.  She is compliant and CPAP is only helped improve her daytime somnolence and fatigue  Weight loss encouraged, compliance with goal of at least 4-6 hrs every night is the expectation. Advised against medications with sedative side effects Cautioned against driving when sleepy - understanding that sleepiness will vary on a day to day basis

## 2023-05-24 NOTE — Patient Instructions (Addendum)
X Ambulatory sat  X CT angiogram chest to look for blood clots  X schedule PFTs  X referral to heart failure for pulmonary hypertension

## 2023-05-29 ENCOUNTER — Ambulatory Visit: Payer: Medicare PPO | Attending: Nurse Practitioner

## 2023-05-29 DIAGNOSIS — I10 Essential (primary) hypertension: Secondary | ICD-10-CM

## 2023-05-29 DIAGNOSIS — G4733 Obstructive sleep apnea (adult) (pediatric): Secondary | ICD-10-CM

## 2023-05-29 DIAGNOSIS — I272 Pulmonary hypertension, unspecified: Secondary | ICD-10-CM

## 2023-05-29 DIAGNOSIS — E785 Hyperlipidemia, unspecified: Secondary | ICD-10-CM

## 2023-05-30 ENCOUNTER — Ambulatory Visit
Admission: EM | Admit: 2023-05-30 | Discharge: 2023-05-30 | Disposition: A | Discharge status: Home / Self Care | Payer: Medicare PPO | Attending: Internal Medicine | Admitting: Internal Medicine

## 2023-05-30 DIAGNOSIS — Z20822 Contact with and (suspected) exposure to covid-19: Secondary | ICD-10-CM | POA: Diagnosis present

## 2023-05-30 LAB — BASIC METABOLIC PANEL
BUN/Creatinine Ratio: 9 — ABNORMAL LOW (ref 12–28)
BUN: 10 mg/dL (ref 8–27)
CO2: 25 mmol/L (ref 20–29)
Calcium: 9.6 mg/dL (ref 8.7–10.3)
Chloride: 104 mmol/L (ref 96–106)
Creatinine, Ser: 1.13 mg/dL — ABNORMAL HIGH (ref 0.57–1.00)
Glucose: 94 mg/dL (ref 70–99)
Potassium: 4.6 mmol/L (ref 3.5–5.2)
Sodium: 143 mmol/L (ref 134–144)
eGFR: 49 mL/min/{1.73_m2} — ABNORMAL LOW (ref >59)

## 2023-05-30 NOTE — Discharge Instructions (Signed)
COVID test is pending.  Will call if it is positive.

## 2023-05-30 NOTE — ED Provider Notes (Signed)
[facility]    CSN: [id number] Arrival date & time: 05/30/23  1855      History   Chief Complaint Chief Complaint  Patient presents with   COVID test    HPI [name] is a 82 y.o. female.   Patient presents today for COVID testing given that someone at her church tested positive for COVID.  Denies any obvious symptoms.  Reports that she has a baseline cough at times given that she wears a CPAP but otherwise no new symptoms.  Denies any fever, chest pain, shortness of breath.     Past Medical History:  Diagnosis Date   Anemia    "years ago"   Arthritis    Asthma    Bradycardia    Cataract    "skin Cancer"   Chest pain    Nuclear, May, 2010, no scar or ischemia   Dyslipidemia    Ejection fraction    EF 60%, May, 2010, echo   GERD (gastroesophageal reflux disease)    Heart murmur    Hemorrhoids    Herpes simplex without mention of complication    History of skin cancer    HTN (hypertension)    Hx of colonic polyps    IBS (irritable bowel syndrome)    Overweight(278.02)    Pneumonia    Tachycardia    Rapid heart beat, October, 2011, 21 day monitor, sinus rhythm, patient senses mild increase in heart rate, low-dose beta blockade try    Patient Active Problem List   Diagnosis Date Noted   Other secondary pulmonary hypertension (HCC) 05/24/2023   Healthcare maintenance 07/30/2020   OSA (obstructive sleep apnea) 03/23/2020   Sleep-wake cycle disorder 03/23/2020   HTN (hypertension) 12/27/2011   Mycosis fungoides (HCC) 12/27/2011   Urinary incontinence 12/27/2011   Chest pain    Ejection fraction    Tachycardia    Dyslipidemia    PALPITATIONS 07/22/2010   DYSLIPIDEMIA 02/17/2009   OBESITY, MORBID 02/17/2009   BRADYCARDIA 02/17/2009   SKIN CANCER, HX OF 02/17/2009   HERPES SIMPLEX INFECTION, TYPE I 09/17/2008   HEMORRHOIDS 09/17/2008   IBS 09/17/2008   COLONIC POLYPS, HX OF 09/17/2008    Past Surgical History:  Procedure  Laterality Date   APPENDECTOMY     CATARACT EXTRACTION Bilateral    OVARY SURGERY     PARTIAL HYSTERECTOMY     TONSILLECTOMY     vocal cord surgery     polyp removed    OB History   No obstetric history on file.      Home Medications    Prior to Admission medications   Medication Sig Start Date End Date Taking? Authorizing Provider  atorvastatin (LIPITOR) 20 MG tablet Take 20 mg by mouth daily.    [provider]  carboxymethylcellulose (REFRESH PLUS) 0.5 % SOLN 1 drop as directed.    [provider]  cyanocobalamin 1000 MCG tablet Take 1,000 mcg by mouth daily.    [provider]  docusate sodium (COLACE) 100 MG capsule Take 100 mg by mouth as needed for mild constipation.    [provider]  furosemide (LASIX) 20 MG tablet Take 20 mg by mouth as needed for fluid.    [provider]  polyethylene glycol powder (GLYCOLAX/MIRALAX) powder Take 17 g by mouth as needed.    [provider]  valsartan (DIOVAN) 320 MG tablet Take 320 mg by mouth daily.    [provider]  verapamil (VERELAN) 360 MG 24 hr  capsule Take 360 mg by mouth at bedtime.    [provider]  Vibegron 75 MG TABS Take 1 tablet by mouth daily.    [provider]    Family History Family History  Problem Relation Age of Onset   Stroke Mother    Hypertension Mother    Diabetes Father    Hypertension Father    Ovarian cancer Maternal Grandmother    Breast cancer Cousin        x 4    Social History Social History   Tobacco Use   Smoking status: Former    Current packs/day: 0.00    Average packs/day: 1 pack/day for 30.0 years (30.0 ttl pk-yrs)    Types: Cigarettes    Start date: 10/11/1955    Quit date: 10/10/1985    Years since quitting: 37.6   Smokeless tobacco: Never  Vaping Use   Vaping status: Never Used  Substance Use Topics   Alcohol use: No   Drug use: No     Allergies   Methylphenidate derivatives   Review  of Systems Review of Systems Per HPI  Physical Exam Triage Vital Signs ED Triage Vitals  Encounter Vitals Group     BP 05/30/23 1921 (!) 160/80     Systolic BP Percentile --      Diastolic BP Percentile --      Pulse Rate 05/30/23 1921 62     Resp 05/30/23 1921 18     Temp 05/30/23 1921 98.2 F (36.8 C)     Temp Source 05/30/23 1921 Oral     SpO2 05/30/23 1921 96 %     Weight --      Height --      Head Circumference --      Peak Flow --      Pain Score 05/30/23 1925 0     Pain Loc --      Pain Education --      Exclude from Growth Chart --    No data found.  Updated Vital Signs BP (!) 160/80 (BP Location: Left Arm)   Pulse 62   Temp 98.2 F (36.8 C) (Oral)   Resp 18   SpO2 96%   Visual Acuity Right Eye Distance:   Left Eye Distance:   Bilateral Distance:    Right Eye Near:   Left Eye Near:    Bilateral Near:     Physical Exam Constitutional:      General: She is not in acute distress.    Appearance: Normal appearance. She is not toxic-appearing or diaphoretic.  HENT:     Head: Normocephalic and atraumatic.  Eyes:     Extraocular Movements: Extraocular movements intact.     Conjunctiva/sclera: Conjunctivae normal.  Cardiovascular:     Rate and Rhythm: Normal rate and regular rhythm.     Pulses: Normal pulses.     Heart sounds: Normal heart sounds.  Pulmonary:     Effort: Pulmonary effort is normal. No respiratory distress.     Breath sounds: Normal breath sounds. No stridor. No wheezing, rhonchi or rales.  Neurological:     General: No focal deficit present.     Mental Status: She is alert and oriented to person, place, and time. Mental status is at baseline.  Psychiatric:        Mood and Affect: Mood normal.        Behavior: Behavior normal.        Thought Content: Thought content normal.  Judgment: Judgment normal.      UC Treatments / Results  Labs (all labs ordered are listed, but only abnormal results are displayed) Labs Reviewed   SARS CORONAVIRUS 2 (TAT 6-24 HRS)    EKG   Radiology No results found.  Procedures Procedures (including critical care time)  Medications Ordered in UC Medications - No data to display  Initial Impression / Assessment and Plan / UC Course  I have reviewed the triage vital signs and the nursing notes.  Pertinent labs & imaging results that were available during my care of the patient were reviewed by me and considered in my medical decision making (see chart for details).     COVID test pending.  Patient reports no new symptoms.  Lung sounds are clear despite baseline cough so do not think that chest imaging is necessary.  Advised strict return precautions.  Patient verbalized understanding and was agreeable with plan. Final Clinical Impressions(s) / UC Diagnoses   Final diagnoses:  Close exposure to COVID-19 virus     Discharge Instructions      COVID test is pending.  Will call if it is positive.    ED Prescriptions   None    PDMP not reviewed this encounter.   Gustavus Bryant, [place] 05/30/23 [phone]

## 2023-05-30 NOTE — ED Triage Notes (Signed)
Pt requested COVID test after exposure 3 days ago.

## 2023-05-31 LAB — SARS CORONAVIRUS 2 (TAT 6-24 HRS): SARS Coronavirus 2: NEGATIVE

## 2023-06-13 ENCOUNTER — Ambulatory Visit
Admission: RE | Admit: 2023-06-13 | Discharge: 2023-06-13 | Disposition: A | Discharge status: Home / Self Care | Payer: Medicare PPO | Source: Ambulatory Visit | Attending: Pulmonary Disease | Admitting: Pulmonary Disease

## 2023-06-13 DIAGNOSIS — I700 Atherosclerosis of aorta: Secondary | ICD-10-CM | POA: Diagnosis not present

## 2023-06-13 DIAGNOSIS — I2729 Other secondary pulmonary hypertension: Secondary | ICD-10-CM

## 2023-06-13 MED ORDER — IOPAMIDOL (ISOVUE-370) INJECTION 76%
500.0000 mL | Freq: Once | INTRAVENOUS | Status: AC | PRN
  Administered 2023-06-13: 80 mL via INTRAVENOUS

## 2023-06-15 ENCOUNTER — Telehealth: Payer: Self-pay | Admitting: Pulmonary Disease

## 2023-06-15 NOTE — Telephone Encounter (Signed)
Patient returning missed call. 

## 2023-06-16 NOTE — Telephone Encounter (Signed)
Patient notified of results yesterday

## 2023-06-30 DIAGNOSIS — R051 Acute cough: Secondary | ICD-10-CM | POA: Diagnosis not present

## 2023-06-30 DIAGNOSIS — N182 Chronic kidney disease, stage 2 (mild): Secondary | ICD-10-CM | POA: Diagnosis not present

## 2023-06-30 DIAGNOSIS — I1310 Hypertensive heart and chronic kidney disease without heart failure, with stage 1 through stage 4 chronic kidney disease, or unspecified chronic kidney disease: Secondary | ICD-10-CM | POA: Diagnosis not present

## 2023-06-30 DIAGNOSIS — R0981 Nasal congestion: Secondary | ICD-10-CM | POA: Diagnosis not present

## 2023-06-30 DIAGNOSIS — J069 Acute upper respiratory infection, unspecified: Secondary | ICD-10-CM | POA: Diagnosis not present

## 2023-07-12 ENCOUNTER — Telehealth: Payer: Self-pay | Admitting: Pulmonary Disease

## 2023-07-12 ENCOUNTER — Encounter (HOSPITAL_BASED_OUTPATIENT_CLINIC_OR_DEPARTMENT_OTHER): Payer: Medicare PPO

## 2023-07-12 NOTE — Telephone Encounter (Signed)
New message   Answering service on the phone.  Patient C/o dry cough, sob, should she still come in for her breathing test today @ 2:30 pm

## 2023-07-12 NOTE — Telephone Encounter (Signed)
Pt already rescheduled pft nfn

## 2023-07-26 DIAGNOSIS — N3946 Mixed incontinence: Secondary | ICD-10-CM | POA: Diagnosis not present

## 2023-07-31 NOTE — Progress Notes (Deleted)
Cardiology Office Note    Patient Name: [name] Date of Encounter: 07/31/2023  Primary Care Provider:  Melida Quitter, MD Primary Cardiologist:  None Primary Electrophysiologist: None   Past Medical History    Past Medical History:  Diagnosis Date   Anemia    "years ago"   Arthritis    Asthma    Bradycardia    Cataract    "skin Cancer"   Chest pain    Nuclear, May, 2010, no scar or ischemia   Dyslipidemia    Ejection fraction    EF 60%, May, 2010, echo   GERD (gastroesophageal reflux disease)    Heart murmur    Hemorrhoids    Herpes simplex without mention of complication    History of skin cancer    HTN (hypertension)    Hx of colonic polyps    IBS (irritable bowel syndrome)    Overweight(278.02)    Pneumonia    Tachycardia    Rapid heart beat, October, 2011, 21 day monitor, sinus rhythm, patient senses mild increase in heart rate, low-dose beta blockade try    History of Present Illness   [name]  is a 82 year old female with a PMH of essential hypertension, OSA (on CPAP), palpitations, shortness of breath, history of bradycardia on beta-blockers who presents today for follow-up.   She was last seen on 05/19/2023 with complaint of shortness of breath with ambulation.  She was euvolemic on examination was not taking Lasix due to leaky bladder syndrome.  She was advised to increase Lasix to 20 mg x 2 days and then 20 mg as needed.  She was being followed by pulmonology for management of pulmonary hypertension.  During today's visit the patient reports*** .  Patient denies chest pain, palpitations, dyspnea, PND, orthopnea, nausea, vomiting, dizziness, syncope, edema, weight gain, or early satiety.  ***Notes: -Last ischemic evaluation: -Last echo: -Interim ED visits: Review of Systems  Please see the history of present illness.    All other systems reviewed and are otherwise negative except as noted above.  Physical Exam    Wt Readings  from Last 3 Encounters:  05/24/23 230 lb (104.3 kg)  05/19/23 234 lb 6.4 oz (106.3 kg)  08/03/22 228 lb (103.4 kg)   ZO:XWRUE were no vitals filed for this visit.,There is no height or weight on file to calculate BMI. GEN: Well nourished, well developed in no acute distress Neck: No JVD; No carotid bruits Pulmonary: Clear to auscultation without rales, wheezing or rhonchi  Cardiovascular: Normal rate. Regular rhythm. Normal S1. Normal S2.   Murmurs: There is no murmur.  ABDOMEN: Soft, non-tender, non-distended EXTREMITIES:  No edema; No deformity   EKG/LABS/ Recent Cardiac Studies   ECG personally reviewed by me today - ***  Risk Assessment/Calculations:   {Does this patient have ATRIAL FIBRILLATION?:[id number]}      Lab Results  Component Value Date   WBC 10.3 04/25/2019   HGB 12.3 04/25/2019   HCT 37.9 04/25/2019   MCV 95.7 04/25/2019   PLT 190 04/25/2019   Lab Results  Component Value Date   CREATININE 1.13 (H) 05/29/2023   BUN 10 05/29/2023   NA 143 05/29/2023   K 4.6 05/29/2023   CL 104 05/29/2023   CO2 25 05/29/2023   Lab Results  Component Value Date   CHOL  02/13/2009    195        ATP III CLASSIFICATION:  <200     mg/dL   Desirable  200-239  mg/dL   Borderline High  >=086    mg/dL   High          HDL 33 (L) 02/13/2009   LDLCALC (H) 02/13/2009    143        Total Cholesterol/HDL:CHD Risk Coronary Heart Disease Risk Table                     Men   Women  1/2 Average Risk   3.4   3.3  Average Risk       5.0   4.4  2 X Average Risk   9.6   7.1  3 X Average Risk  23.4   11.0        Use the calculated Patient Ratio above and the CHD Risk Table to determine the patient's CHD Risk.        ATP III CLASSIFICATION (LDL):  <100     mg/dL   Optimal  578-469  mg/dL   Near or Above                    Optimal  130-159  mg/dL   Borderline  629-528  mg/dL   High  >413     mg/dL   Very High   TRIG 97 24/40/1027   CHOLHDL 5.9 02/13/2009    No results found  for: "HGBA1C" Assessment & Plan    1.  Essential hypertension: -Patient's blood pressure today was controlled at 130/58 -Continue valsartan 320 mg and verapamil 240 mg   2.  Hyperlipidemia: -Patient's LDL cholesterol was 76 -Continue Lipitor 20 mg daily   3.  HFpEF/shortness of breath: -2D echo completed 04/17/2023 with EF of 60 to 65% and grade 1 DD with mild MVR and moderate TVR with mild to moderate AVR and thickening of the leaflets and no evidence of aortic stenosis. -Patient reports ongoing shortness of breath with fatigue especially with ambulation. -She denies any associated dizziness or palpitations with current symptoms. -We will have her take Lasix 20 mg twice daily x 2 days and then 20 mg as needed. -BMET in 1 week and patient was advised to increase consumption of foods rich in potassium. -Low sodium diet, fluid restriction <2L, and daily weights encouraged. Educated to contact our office for weight gain of 2 lbs overnight or 5 lbs in one week.    4.  Pulmonary HTN: -Patient is currently being followed by pulmonology -2D echo was completed 04/17/2023 showing TV regurgitation and moderately elevated pulmonary artery systolic pressure -Continue diuretic as noted above   5.  Morbid obesity: -Patient's BMI is 42.19 kg/m -Continue low-sodium heart healthy diet     Disposition: Follow-up with None or APP in *** months {Are you ordering a CV Procedure (e.g. stress test, cath, DCCV, TEE, etc)?   Press F2        :[id number]}   Signed, Napoleon Form, Leodis Rains, NP 07/31/2023, 6:41 PM [facility]

## 2023-08-01 ENCOUNTER — Ambulatory Visit: Payer: Medicare PPO | Attending: Nurse Practitioner | Admitting: Nurse Practitioner

## 2023-08-01 DIAGNOSIS — I272 Pulmonary hypertension, unspecified: Secondary | ICD-10-CM

## 2023-08-01 DIAGNOSIS — E785 Hyperlipidemia, unspecified: Secondary | ICD-10-CM

## 2023-08-01 DIAGNOSIS — I5032 Chronic diastolic (congestive) heart failure: Secondary | ICD-10-CM

## 2023-08-01 DIAGNOSIS — I10 Essential (primary) hypertension: Secondary | ICD-10-CM

## 2023-08-02 ENCOUNTER — Ambulatory Visit (HOSPITAL_BASED_OUTPATIENT_CLINIC_OR_DEPARTMENT_OTHER): Payer: Medicare PPO | Admitting: Pulmonary Disease

## 2023-08-02 DIAGNOSIS — I2729 Other secondary pulmonary hypertension: Secondary | ICD-10-CM | POA: Diagnosis not present

## 2023-08-02 LAB — PULMONARY FUNCTION TEST
DL/VA % pred: 154 %
DL/VA: 6.42 ml/min/mmHg/L
DLCO cor % pred: 79 %
DLCO cor: 13.48 ml/min/mmHg
DLCO unc % pred: 79 %
DLCO unc: 13.48 ml/min/mmHg
FEF 25-75 Post: 2.57 L/s
FEF 25-75 Pre: 2.02 L/s
FEF2575-%Change-Post: 27 %
FEF2575-%Pred-Post: 207 %
FEF2575-%Pred-Pre: 162 %
FEV1-%Change-Post: 19 %
FEV1-%Pred-Post: 76 %
FEV1-%Pred-Pre: 64 %
FEV1-Post: 1.27 L
FEV1-Pre: 1.07 L
FEV1FVC-%Change-Post: -3 %
FEV1FVC-%Pred-Pre: 123 %
FEV6-%Change-Post: 26 %
FEV6-%Pred-Post: 67 %
FEV6-%Pred-Pre: 53 %
FEV6-Post: 1.44 L
FEV6-Pre: 1.13 L
FEV6FVC-%Pred-Post: 106 %
FEV6FVC-%Pred-Pre: 106 %
FVC-%Change-Post: 23 %
FVC-%Pred-Post: 63 %
FVC-%Pred-Pre: 51 %
FVC-Post: 1.44 L
FVC-Pre: 1.16 L
Post FEV1/FVC ratio: 88 %
Post FEV6/FVC ratio: 100 %
Pre FEV1/FVC ratio: 91 %
Pre FEV6/FVC Ratio: 100 %
RV % pred: 127 %
RV: 2.86 L
TLC % pred: 94 %
TLC: 4.37 L

## 2023-08-02 NOTE — Progress Notes (Signed)
Full PFT Performed Today  

## 2023-08-02 NOTE — Patient Instructions (Signed)
Full PFT Performed Today  

## 2023-08-08 ENCOUNTER — Other Ambulatory Visit (HOSPITAL_BASED_OUTPATIENT_CLINIC_OR_DEPARTMENT_OTHER): Payer: Self-pay

## 2023-08-08 MED ORDER — FLUTICASONE FUROATE-VILANTEROL 100-25 MCG/ACT IN AEPB: 1.0000 | INHALATION_SPRAY | Freq: Every day | RESPIRATORY_TRACT | 0 refills | Status: AC

## 2023-08-22 DIAGNOSIS — H40053 Ocular hypertension, bilateral: Secondary | ICD-10-CM | POA: Diagnosis not present

## 2023-08-23 DIAGNOSIS — Z1231 Encounter for screening mammogram for malignant neoplasm of breast: Secondary | ICD-10-CM | POA: Diagnosis not present

## 2023-09-05 DIAGNOSIS — G4733 Obstructive sleep apnea (adult) (pediatric): Secondary | ICD-10-CM | POA: Diagnosis not present

## 2023-09-22 DIAGNOSIS — I1310 Hypertensive heart and chronic kidney disease without heart failure, with stage 1 through stage 4 chronic kidney disease, or unspecified chronic kidney disease: Secondary | ICD-10-CM | POA: Diagnosis not present

## 2023-09-22 DIAGNOSIS — N182 Chronic kidney disease, stage 2 (mild): Secondary | ICD-10-CM | POA: Diagnosis not present

## 2023-09-22 DIAGNOSIS — E538 Deficiency of other specified B group vitamins: Secondary | ICD-10-CM | POA: Diagnosis not present

## 2023-09-22 DIAGNOSIS — E785 Hyperlipidemia, unspecified: Secondary | ICD-10-CM | POA: Diagnosis not present

## 2023-09-22 DIAGNOSIS — I5032 Chronic diastolic (congestive) heart failure: Secondary | ICD-10-CM | POA: Diagnosis not present

## 2023-09-22 DIAGNOSIS — E042 Nontoxic multinodular goiter: Secondary | ICD-10-CM | POA: Diagnosis not present

## 2023-09-29 DIAGNOSIS — Z0000 Encounter for general adult medical examination without abnormal findings: Secondary | ICD-10-CM | POA: Diagnosis not present

## 2023-09-29 DIAGNOSIS — Z1339 Encounter for screening examination for other mental health and behavioral disorders: Secondary | ICD-10-CM | POA: Diagnosis not present

## 2023-09-29 DIAGNOSIS — Z6841 Body Mass Index (BMI) 40.0 and over, adult: Secondary | ICD-10-CM | POA: Diagnosis not present

## 2023-09-29 DIAGNOSIS — N3946 Mixed incontinence: Secondary | ICD-10-CM | POA: Diagnosis not present

## 2023-09-29 DIAGNOSIS — I5032 Chronic diastolic (congestive) heart failure: Secondary | ICD-10-CM | POA: Diagnosis not present

## 2023-09-29 DIAGNOSIS — C8400 Mycosis fungoides, unspecified site: Secondary | ICD-10-CM | POA: Diagnosis not present

## 2023-09-29 DIAGNOSIS — I272 Pulmonary hypertension, unspecified: Secondary | ICD-10-CM | POA: Diagnosis not present

## 2023-09-29 DIAGNOSIS — N182 Chronic kidney disease, stage 2 (mild): Secondary | ICD-10-CM | POA: Diagnosis not present

## 2023-09-29 DIAGNOSIS — Z87891 Personal history of nicotine dependence: Secondary | ICD-10-CM | POA: Diagnosis not present

## 2023-09-29 DIAGNOSIS — Z1331 Encounter for screening for depression: Secondary | ICD-10-CM | POA: Diagnosis not present

## 2023-09-29 DIAGNOSIS — I700 Atherosclerosis of aorta: Secondary | ICD-10-CM | POA: Diagnosis not present

## 2023-09-29 DIAGNOSIS — I130 Hypertensive heart and chronic kidney disease with heart failure and stage 1 through stage 4 chronic kidney disease, or unspecified chronic kidney disease: Secondary | ICD-10-CM | POA: Diagnosis not present

## 2023-10-23 ENCOUNTER — Encounter (HOSPITAL_COMMUNITY): Payer: Self-pay | Admitting: Cardiology

## 2023-10-23 ENCOUNTER — Ambulatory Visit (HOSPITAL_COMMUNITY)
Admission: RE | Admit: 2023-10-23 | Discharge: 2023-10-23 | Disposition: A | Discharge status: Home / Self Care | Payer: Medicare PPO | Source: Ambulatory Visit | Attending: Cardiology | Admitting: Cardiology

## 2023-10-23 ENCOUNTER — Encounter: Payer: Self-pay | Admitting: *Deleted

## 2023-10-23 VITALS — BP 120/60 | HR 59 | Wt 231.0 lb

## 2023-10-23 DIAGNOSIS — G4733 Obstructive sleep apnea (adult) (pediatric): Secondary | ICD-10-CM | POA: Insufficient documentation

## 2023-10-23 DIAGNOSIS — R001 Bradycardia, unspecified: Secondary | ICD-10-CM | POA: Diagnosis not present

## 2023-10-23 DIAGNOSIS — Z006 Encounter for examination for normal comparison and control in clinical research program: Secondary | ICD-10-CM

## 2023-10-23 DIAGNOSIS — I110 Hypertensive heart disease with heart failure: Secondary | ICD-10-CM | POA: Insufficient documentation

## 2023-10-23 DIAGNOSIS — I272 Pulmonary hypertension, unspecified: Secondary | ICD-10-CM | POA: Insufficient documentation

## 2023-10-23 DIAGNOSIS — I5030 Unspecified diastolic (congestive) heart failure: Secondary | ICD-10-CM | POA: Insufficient documentation

## 2023-10-23 DIAGNOSIS — J449 Chronic obstructive pulmonary disease, unspecified: Secondary | ICD-10-CM | POA: Diagnosis not present

## 2023-10-23 MED ORDER — FLUTICASONE FUROATE-VILANTEROL 100-25 MCG/ACT IN AEPB: 1.0000 | INHALATION_SPRAY | Freq: Every day | RESPIRATORY_TRACT | 2 refills | Status: AC

## 2023-10-23 NOTE — Addendum Note (Signed)
 Encounter addended by: Romie Minus, MD on: 10/23/2023 8:16 PM  Actions taken: Clinical Note Signed, Level of Service modified

## 2023-10-23 NOTE — Patient Instructions (Signed)
 Great to see you today!  No medication changes  Your physician recommends that you schedule a follow-up appointment in: 2 months as scheduled  If you have any questions or concerns before your next appointment please send us  a message through [place] or call our office at [phone].    TO LEAVE A MESSAGE FOR THE NURSE SELECT OPTION 2, PLEASE LEAVE A MESSAGE INCLUDING: YOUR NAME DATE OF BIRTH CALL BACK NUMBER REASON FOR CALL**this is important as we prioritize the call backs  YOU WILL RECEIVE A CALL BACK THE SAME DAY AS LONG AS YOU CALL BEFORE 4:00 PM At the [facility], you and your health needs are our priority. As part of our continuing mission to provide you with exceptional heart care, we have created designated Provider Care Teams. These Care Teams include your primary Cardiologist (physician) and Advanced Practice Providers (APPs- Physician Assistants and Nurse Practitioners) who all work together to provide you with the care you need, when you need it.   You may see any of the following providers on your designated Care Team at your next follow up: Dr Toribio Fuel Dr Ezra Shuck Dr. Ria Commander Dr. [name] Amy Lenetta, NP Caffie Shed, [place] [facility] [place], [place] Beckey Coe, NP Jordan Lee, NP Tinnie Redman, PharmD   Please be sure to bring in all your medications bottles to every appointment.    Thank you for choosing [facility]

## 2023-10-23 NOTE — Research (Signed)
 Spoke with patient about Level study  Gave her informed consent and pamphlet about study.  She will contact me after she reviews the consent.   Suzen Hardy :) RN BSN  Clinical Research Nurse  Be strong and take heart, all you who hope in the [place]. ~ Psalm 31:24

## 2023-10-23 NOTE — Progress Notes (Signed)
   ADVANCED HEART FAILURE NEW PATIENT CLINIC NOTE  Referring Physician: Stephane Leita DEL, MD  Primary Care: Stephane Leita DEL, MD Primary Cardiologist:  HPI: [name] is a 83 y.o. female with a PMH of essential hypertension, OSA (on CPAP), palpitations, shortness of breath who presents for initial visit for further evaluation and treatment of pulmonary hypertension      05/2021 patient presented with complaint of shortness of breath and Dr. Claudene completed a coronary CTA that showed aortic atherosclerosis with no evidence of CAD and dilated pulmonary arteries suggestive of pulmonary HTN.  She continued to have complaints of shortness of breath and was seen by Dr. Delford on 09/14/2021.  2D echo completed 03/03/2020 showed no significant valve disease and EF of 60 to 65%.  She was started on Lasix  20 mg daily but was not taking daily.  Patient was advised to follow-up with pulmonology and blood pressures were noted to be well-controlled. 2D echo completed 04/17/2023 with EF of 60 to 65% and grade 1 DD with mild MVR and moderate TVR with mild to moderate AVR and thickening of the leaflets and no evidence of aortic stenosis.     SUBJECTIVE:  Patient continues to report symptoms of dyspnea on exertion. She reports that these symptoms have been ongoing for years. She expereinces some relief with diuretics but continues to be short of breath. She has no history of atrial fibrillation. She has been on the same BP meds for some time. She does report some improvement on her inhalers.  PMH, current medications, allergies, social history, and family history reviewed in epic.  PHYSICAL EXAM: Vitals:   10/23/23 1509  BP: 120/60  Pulse: (!) 59  SpO2: 97%   GENERAL: Well nourished and in no apparent distress at rest.  HEENT: The mucous membranes are pink and moist.   PULM:  Normal work of breathing, clear to auscultation bilaterally. Respirations are unlabored.  CARDIAC:  JVP: mildly elevated          Bradycardic, no significant murmurs, 1+ LE edema ABDOMEN: Soft, non-tender, non-distended. NEUROLOGIC: Patient is oriented x3 with no focal or lateralizing neurologic deficits.  PSYCH: Patients affect is appropriate, there is no evidence of anxiety or depression.  SKIN: Warm and dry; no lesions or wounds. Warm and well perfused extremities.   ASSESSMENT & PLAN:  Pulmonary hypertension: Noted on previous echocardiogram, RVSP 48. Has known sleep apnea but treated, echo consistent with at least grade I DD. Would benefit from RHC for further evaluation especially given her only moderate disease on PFTs. Discussed for LEVEL study, will refrain from medication adjustements until after procedure. - RHC ordered - Continue OSA therapy - Continue COPD therapy  HFpEF: BP fairly well controlled, would benefit from decreasing verapamil  given her diuretic use and bradycardia. Leaky bladder syndrome so hold on SGLT-2. - RHC as above, then plan on decreasing verapamil  - Adjust diuretics after procedure - No SGLT-2  I have reviewed the risks, indications, and alternatives to cardiac catheterization +/- angioplasty or stenting with the patient. Risks include but are not limited to bleeding, infection, vascular injury, stroke, myocardial infection, arrhythmia, kidney injury, radiation-related injury in the case of prolonged fluoroscopy use, emergency cardiac surgery, and death. The patient understands the risks of serious complication is low (<1%) and she agrees to proceed.    [name], MD Advanced [facility] Failure Mechanical Circulatory Support 10/23/23

## 2023-11-21 DIAGNOSIS — Z1152 Encounter for screening for COVID-19: Secondary | ICD-10-CM | POA: Diagnosis not present

## 2023-11-21 DIAGNOSIS — J029 Acute pharyngitis, unspecified: Secondary | ICD-10-CM | POA: Diagnosis not present

## 2023-11-21 DIAGNOSIS — G4733 Obstructive sleep apnea (adult) (pediatric): Secondary | ICD-10-CM | POA: Diagnosis not present

## 2023-11-21 DIAGNOSIS — R0981 Nasal congestion: Secondary | ICD-10-CM | POA: Diagnosis not present

## 2023-11-21 DIAGNOSIS — R5383 Other fatigue: Secondary | ICD-10-CM | POA: Diagnosis not present

## 2023-11-21 DIAGNOSIS — R058 Other specified cough: Secondary | ICD-10-CM | POA: Diagnosis not present

## 2023-11-21 DIAGNOSIS — J0100 Acute maxillary sinusitis, unspecified: Secondary | ICD-10-CM | POA: Diagnosis not present

## 2023-11-21 DIAGNOSIS — I5032 Chronic diastolic (congestive) heart failure: Secondary | ICD-10-CM | POA: Diagnosis not present

## 2023-12-06 DIAGNOSIS — L308 Other specified dermatitis: Secondary | ICD-10-CM | POA: Diagnosis not present

## 2023-12-07 DIAGNOSIS — G4733 Obstructive sleep apnea (adult) (pediatric): Secondary | ICD-10-CM | POA: Diagnosis not present

## 2023-12-21 ENCOUNTER — Encounter (HOSPITAL_COMMUNITY): Payer: Self-pay | Admitting: Cardiology

## 2023-12-21 ENCOUNTER — Ambulatory Visit (HOSPITAL_COMMUNITY)
Admission: RE | Admit: 2023-12-21 | Discharge: 2023-12-21 | Disposition: A | Discharge status: Home / Self Care | Payer: Medicare PPO | Source: Ambulatory Visit | Attending: Cardiology | Admitting: Cardiology

## 2023-12-21 VITALS — BP 118/70 | HR 71 | Wt 231.0 lb

## 2023-12-21 DIAGNOSIS — N329 Bladder disorder, unspecified: Secondary | ICD-10-CM | POA: Insufficient documentation

## 2023-12-21 DIAGNOSIS — I272 Pulmonary hypertension, unspecified: Secondary | ICD-10-CM | POA: Insufficient documentation

## 2023-12-21 DIAGNOSIS — J449 Chronic obstructive pulmonary disease, unspecified: Secondary | ICD-10-CM | POA: Diagnosis not present

## 2023-12-21 DIAGNOSIS — I5030 Unspecified diastolic (congestive) heart failure: Secondary | ICD-10-CM | POA: Insufficient documentation

## 2023-12-21 DIAGNOSIS — G4733 Obstructive sleep apnea (adult) (pediatric): Secondary | ICD-10-CM | POA: Insufficient documentation

## 2023-12-21 DIAGNOSIS — Z79899 Other long term (current) drug therapy: Secondary | ICD-10-CM | POA: Insufficient documentation

## 2023-12-21 DIAGNOSIS — I110 Hypertensive heart disease with heart failure: Secondary | ICD-10-CM | POA: Insufficient documentation

## 2023-12-21 MED ORDER — VERAPAMIL HCL ER 180 MG PO TBCR: 180.0000 mg | EXTENDED_RELEASE_TABLET | Freq: Every day | ORAL | 11 refills | Status: DC

## 2023-12-21 NOTE — Patient Instructions (Signed)
 There has been no changes to your medications.  Your physician recommends that you schedule a follow-up appointment in: 6 months (September) ** PLEASE CALL THE OFFICE IN JULY TO ARRANGE YOUR FOLLOW UP APPOINTMENT.**  If you have any questions or concerns before your next appointment please send [place] a message through [place] or call our office at [phone].    TO LEAVE A MESSAGE FOR THE NURSE SELECT OPTION 2, PLEASE LEAVE A MESSAGE INCLUDING: YOUR NAME DATE OF BIRTH CALL BACK NUMBER REASON FOR CALL**this is important as we prioritize the call backs  YOU WILL RECEIVE A CALL BACK THE SAME DAY AS LONG AS YOU CALL BEFORE 4:00 PM  At the [facility], you and your health needs are our priority. As part of our continuing mission to provide you with exceptional heart care, we have created designated Provider Care Teams. These Care Teams include your primary Cardiologist (physician) and Advanced Practice Providers (APPs- Physician Assistants and Nurse Practitioners) who all work together to provide you with the care you need, when you need it.   You may see any of the following providers on your designated Care Team at your next follow up: Dr Arvilla Meres Dr Marca Ancona Dr. Dorthula Nettles Dr. Clearnce Hasten Amy Filbert Schilder, NP Robbie Lis, [place] [facility] [place], [place] Brynda Peon, NP [place] Lee, NP Clarisa Kindred, NP Karle Plumber, PharmD Enos Fling, PharmD   Please be sure to bring in all your medications bottles to every appointment.    Thank you for choosing [facility]

## 2023-12-21 NOTE — Progress Notes (Signed)
   ADVANCED HEART FAILURE FOLLOW UP CLINIC NOTE  Referring Physician: Melida Quitter, MD  Primary Care: Melida Quitter, MD Primary Cardiologist:  HPI: [name] is a 83 y.o. female who presents for follow up of pulmonary hypertension.      05/2021 patient presented with complaint of shortness of breath and Dr. Katrinka Blazing completed a coronary CTA that showed aortic atherosclerosis with no evidence of CAD and dilated pulmonary arteries suggestive of pulmonary HTN.  She continued to have complaints of shortness of breath and was seen by Dr. Eden Emms on 09/14/2021.  2D echo completed 03/03/2020 showed no significant valve disease and EF of 60 to 65%.  She was started on Lasix 20 mg daily but was not taking daily.  Patient was advised to follow-up with pulmonology and blood pressures were noted to be well-controlled. 2D echo completed 04/17/2023 with EF of 60 to 65% and grade 1 DD with mild MVR and moderate TVR with mild to moderate AVR and thickening of the leaflets and no evidence of aortic stenosis.       SUBJECTIVE:  PMH, current medications, allergies, social history, and family history reviewed in epic.  PHYSICAL EXAM: Vitals:   12/21/23 1337  BP: 118/70  Pulse: 71  SpO2: 98%   GENERAL: Well nourished and in no apparent distress at rest.  PULM:  Normal work of breathing, clear to auscultation bilaterally. Respirations are unlabored.  CARDIAC:  JVP: not elevated         Normal rate with regular rhythm. No murmurs, rubs or gallops.  Trace edema. Warm and well perfused extremities. ABDOMEN: Soft, non-tender, non-distended. NEUROLOGIC: Patient is oriented x3 with no focal or lateralizing neurologic deficits.     ASSESSMENT & PLAN:  Pulmonary hypertension: Noted on previous echocardiogram, RVSP 48. Has known sleep apnea but treated, echo consistent with at least grade I DD. Fairly well compensated, no worsening swelling.  Had discussed the LEVEL trial previously, would like to hold off  at this time. - Continue OSA therapy - Continue COPD therapy, reach out to PCP for refill   HFpEF: BP fairly well controlled. Will decrease verapamil to 180mg  daily. Leaky bladder syndrome so hold on SGLT-2. - Decrease verapamil to 180mg  daily - Continue current diuretic regimen - No SGLT-2  Follow up in 6 months  Clearnce Hasten, MD Advanced Heart Failure Mechanical Circulatory Support 12/21/23

## 2023-12-22 ENCOUNTER — Telehealth: Payer: Self-pay | Admitting: Pulmonary Disease

## 2023-12-22 NOTE — Telephone Encounter (Signed)
 PT req to speak w/Dr. Reginia Naas nurse about her Breo. Ins will not cover it and she needs to know an alternative to this medication. She was referred to Dr. Elwyn Lade to take over her care and he can not write her a RX since he is not the original prescriber and he was the one who told her to have Dr. Vassie Loll address what the alternative to [facility] would be. Her # is [phone]  Her RX says she can get 2 refills up to 10/2024 but ins won't pay for it. She has not been seen in a year by Dr. Vassie Loll. I will make an appt for her w/an NP.

## 2023-12-22 NOTE — Telephone Encounter (Signed)
Please advise on alternative med.

## 2023-12-25 NOTE — Telephone Encounter (Signed)
 Pt states she does not have Albuterol inhaler she has no used this in years as it was for when she wheezed she needs something for daily as she is not currently having any wheezing.

## 2023-12-26 MED ORDER — BUDESONIDE-FORMOTEROL FUMARATE 160-4.5 MCG/ACT IN AERO: 2.0000 | INHALATION_SPRAY | Freq: Two times a day (BID) | RESPIRATORY_TRACT | 6 refills | Status: AC

## 2024-01-03 ENCOUNTER — Encounter (HOSPITAL_BASED_OUTPATIENT_CLINIC_OR_DEPARTMENT_OTHER): Payer: Self-pay | Admitting: Primary Care

## 2024-01-03 ENCOUNTER — Ambulatory Visit (HOSPITAL_BASED_OUTPATIENT_CLINIC_OR_DEPARTMENT_OTHER): Admitting: Primary Care

## 2024-01-03 VITALS — BP 122/78 | HR 84 | Ht 61.0 in | Wt 237.2 lb

## 2024-01-03 DIAGNOSIS — I2729 Other secondary pulmonary hypertension: Secondary | ICD-10-CM | POA: Diagnosis not present

## 2024-01-03 DIAGNOSIS — G4733 Obstructive sleep apnea (adult) (pediatric): Secondary | ICD-10-CM

## 2024-01-03 NOTE — Progress Notes (Signed)
 @Patient  ID: [name], female    DOB: 03/12/1941, 83 y.o.   MRN: [id number]  No chief complaint on file.   Referring provider: Melida Quitter, MD  HPI: 83 year old female, former smoker. PMH significant for HTN, pulmonary hypertension, OSA, dyslipidemia, obesity.   Previous [facility] pulmonary encounter  05/23/24- Dr. Vassie Loll This is a new finding compared to previous echo in 2021 which showed normal pulmonary artery pressure.  She is a remote smoker less than 15 pack years I doubt significant COPD.  She has no risk factors for or prior history of VTE but we will obtain CT angiogram chest to rule out CTEPH.  This is likely WHO 2 related to cardiac disease and she will likely need right heart cath.  There is no evidence of coronary artery calcification and she may not need left heart cath.  Will also refer her to advanced heart failure service for right heart cath.  Her OSA is fully treated and doubt is contributing much to the pulm hypertension   01/03/2024- Interim hx  Discussed the use of AI scribe software for clinical note transcription with the patient, who gave verbal consent to proceed.  History of Present Illness   [name] is an 83 year old female with pulmonary hypertension and obstructive lung disease who presents for a follow-up visit. She was referred by Dr. Vassie Loll for evaluation of pulmonary hypertension.  During her last visit she was given a new dx secondary pulmonary HTN, WHO group 2 likely related to cardiac disease. Referred to cardiology for right heart cath.  CTA showed clear lungs without evidence of acute pulmonry embolism. OSA is fully treated and not felt to be contributing to pulm HTN.   She is a former remote smoker. PFTs in October showed moderate-severe restriction with moderate reversible obstruction and increased diffusion capacity. It was recommended that she be started on BREO 100 which was later changed to Symbicort due to insurance coverage.   She  has a history of pulmonary hypertension and was referred to a cardiologist for further evaluation. She saw the cardiologist in March and is scheduled to follow up in six months.  She remains compliant with CPAP machine for sleep apnea, which is well-controlled, and takes diuretics as needed for mild heart failure. She has obstructive lung disease with an asthmatic component, as evidenced by reversibility on bronchodilator testing. She reports some improvement in her breathing with Symbicort, although she still experiences shortness of breath, especially when walking uphill or long distances. No significant wheezing or cough, although she has experienced a cough recently and occasional wheezing.  Her past medical history includes a remote smoking history, having smoked in her twenties and thirties, but she quit over forty years ago.  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      Airview download 12/02/23-12/31/23 Usage days 29/30 days; 24 days (80%) > 4 hours Average usage 4 hours 35 mins Pressure settings 5-15cm h20 (14.4cm h20-95%)  Airleaks 25L/min (95%) AHI 2.4    Allergies  Allergen Reactions   Methylphenidate Derivatives     Makes breast enlarged    Immunization History  Administered Date(s) Administered   Fluad Quad(high Dose 65+) 08/03/2022   Moderna Sars-Covid-2 Vaccination 11/18/2019, 12/18/2019    Past Medical History:  Diagnosis Date   Anemia    "years ago"   Arthritis    Asthma    Bradycardia    Cataract    "  skin Cancer"   Chest pain    Nuclear, May, 2010, no scar or ischemia   Dyslipidemia    Ejection fraction    EF 60%, May, 2010, echo   GERD (gastroesophageal reflux disease)    Heart murmur    Hemorrhoids    Herpes simplex without mention of complication    History of skin cancer    HTN (hypertension)    Hx of colonic polyps    IBS (irritable bowel syndrome)    Overweight(278.02)    Pneumonia     Tachycardia    Rapid heart beat, October, 2011, 21 day monitor, sinus rhythm, patient senses mild increase in heart rate, low-dose beta blockade try    Tobacco History: Social History   Tobacco Use  Smoking Status Former   Current packs/day: 0.00   Average packs/day: 1 pack/day for 30.0 years (30.0 ttl pk-yrs)   Types: Cigarettes   Start date: 10/11/1955   Quit date: 10/10/1985   Years since quitting: 38.2  Smokeless Tobacco Never   Counseling given: Not Answered   Outpatient Medications Prior to Visit  Medication Sig Dispense Refill   acetaminophen (TYLENOL 8 HOUR) 650 MG CR tablet Take 1,300 mg by mouth as needed for pain.     atorvastatin (LIPITOR) 20 MG tablet Take 20 mg by mouth daily.     budesonide-formoterol (SYMBICORT) 160-4.5 MCG/ACT inhaler Inhale 2 puffs into the lungs 2 (two) times daily. 1 each 6   carboxymethylcellulose (REFRESH PLUS) 0.5 % SOLN 1 drop as directed.     cyanocobalamin 1000 MCG tablet Take 1,000 mcg by mouth daily.     docusate sodium (COLACE) 100 MG capsule Take 100 mg by mouth as needed for mild constipation.     fluticasone furoate-vilanterol (BREO ELLIPTA) 100-25 MCG/ACT AEPB Inhale 1 puff into the lungs daily. 30 each 2   furosemide (LASIX) 20 MG tablet Take 20 mg by mouth as needed for fluid.     polyethylene glycol powder (GLYCOLAX/MIRALAX) powder Take 17 g by mouth as needed.     valsartan (DIOVAN) 320 MG tablet Take 320 mg by mouth daily.     verapamil (CALAN-SR) 180 MG CR tablet Take 1 tablet (180 mg total) by mouth daily. 30 tablet 11   Vibegron 75 MG TABS Take 1 tablet by mouth daily.     No facility-administered medications prior to visit.    Review of Systems  Review of Systems  Constitutional: Negative.   HENT: Negative.    Respiratory:  Positive for shortness of breath. Negative for cough, chest tightness and wheezing.   Cardiovascular:  Negative for leg swelling.   Physical Exam  There were no vitals taken for this  visit. Physical Exam Constitutional:      General: She is not in acute distress.    Appearance: Normal appearance. She is not ill-appearing.  HENT:     Head: Normocephalic and atraumatic.     Mouth/Throat:     Mouth: Mucous membranes are moist.     Pharynx: Oropharynx is clear.  Cardiovascular:     Rate and Rhythm: Normal rate.     Heart sounds: Murmur heard.     Comments: +Trace ankle edema Pulmonary:     Effort: Pulmonary effort is normal.     Breath sounds: No wheezing, rhonchi or rales.  Musculoskeletal:        General: Normal range of motion.  Skin:    General: Skin is warm and dry.  Neurological:     General:  No focal deficit present.     Mental Status: She is alert and oriented to person, place, and time. Mental status is at baseline.  Psychiatric:        Mood and Affect: Mood normal.        Behavior: Behavior normal.        Thought Content: Thought content normal.        Judgment: Judgment normal.      Lab Results:  CBC    Component Value Date/Time   WBC 10.3 04/25/2019 1626   RBC 3.96 04/25/2019 1626   HGB 12.3 04/25/2019 1626   HCT 37.9 04/25/2019 1626   PLT 190 04/25/2019 1626   MCV 95.7 04/25/2019 1626   MCH 31.1 04/25/2019 1626   MCHC 32.5 04/25/2019 1626   RDW 12.6 04/25/2019 1626   LYMPHSABS 2.7 11/10/2015 2033   MONOABS 0.6 11/10/2015 2033   EOSABS 0.2 11/10/2015 2033   BASOSABS 0.0 11/10/2015 2033    BMET    Component Value Date/Time   NA 143 05/29/2023 1321   K 4.6 05/29/2023 1321   CL 104 05/29/2023 1321   CO2 25 05/29/2023 1321   GLUCOSE 94 05/29/2023 1321   GLUCOSE 112 (H) 04/25/2019 1626   BUN 10 05/29/2023 1321   CREATININE 1.13 (H) 05/29/2023 1321   CALCIUM 9.6 05/29/2023 1321   GFRNONAA 42 (L) 04/25/2019 1626   GFRAA 48 (L) 04/25/2019 1626    BNP No results found for: "BNP"  ProBNP    Component Value Date/Time   PROBNP 151 09/10/2021 1607   PROBNP 56.0 02/13/2009 0033    Imaging: No results found.   Assessment  & Plan:   1. OSA (obstructive sleep apnea) (Primary)  2. Other secondary pulmonary hypertension (HCC)  Assessment and Plan    Pulmonary Hypertension Hx OSA, COPD/asthma and diastolic dysfunction. OSA is well-controlled on auto CPAP. CTA negative for PE. PFTs showed moderate obstruction with reversibly. Mild heart failure with diastolic dysfunction noted. Cardiologist Dr. Elwyn Lade involved, there was mention of getting a RHC during initial evaluation but does not appear this was pursued. She does report some improvement in dyspnea with addition of ICS/LABA.  - Monitor for leg swelling or worsening dyspnea. - Consult Dr. Elwyn Lade regarding right heart catheterization. - Consider repeat echocardiogram before September cardiology visit.  Obstructive Lung Disease with Asthmatic Component Reversibility post-albuterol indicates asthmatic component. Managed with Symbicort 2 puffs BID, reporting improvement in dyspnea with addition of maintenance inhaler. Smoking history not significant. - Continue Symbicort inhaler twice daily. - Monitor for wheezing or cough.  Mild Heart Failure with Diastolic Dysfunction Managed with diuretics as needed. Cardiologist involved for further assessment based on symptoms. - Use diuretics as needed. - Follow up with cardiologist as scheduled.   OSA - Well controlled on auto CPAP. Patient is 96% compliant with CPA use over the last 30 days. Average usage 4 hours 35 mins. Pressure settings 5-15cm h20 (14.4cm h20-95%); Residual AHI 2.4/hour. No changes recommended. Continue to encourage patient wear CPAP nightly for 4-6 hours or longer.     40 mins spent on case; > 50% face to face with patient reviewing testing results and plan of care  Glenford Bayley, NP 01/03/2024

## 2024-01-03 NOTE — Patient Instructions (Signed)
-  PULMONARY HYPERTENSION: Pulmonary hypertension is high blood pressure in the arteries of your lungs. You should continue using your CPAP machine every night to manage your sleep apnea. Please monitor for any leg swelling or worsening shortness of breath. We will consult with Dr. Elwyn Lade about whether a right heart catheterization is needed and schedule a repeat echocardiogram before your cardiology visit in September.  -OBSTRUCTIVE LUNG DISEASE WITH ASTHMATIC COMPONENT: Obstructive lung disease with an asthmatic component means that your airways are partially blocked and can improve with medication. Continue using your Symbicort inhaler twice daily and keep an eye out for any wheezing or cough.  -MILD HEART FAILURE WITH DIASTOLIC DYSFUNCTION: Mild heart failure with diastolic dysfunction means your heart has trouble relaxing and filling with blood. You should use diuretics as needed to manage this condition and follow up with your cardiologist as scheduled.  INSTRUCTIONS: Please follow up with Dr. Elwyn Lade regarding the right heart catheterization and schedule a repeat echocardiogram before your cardiology visit in September. Continue using your CPAP machine nightly and Symbicort inhaler twice daily. Monitor for any leg swelling, worsening shortness of breath, wheezing, or cough.  Follow-up 6 months with Dr. Vassie Loll or Waynetta Sandy NP

## 2024-01-10 DIAGNOSIS — G4452 New daily persistent headache (NDPH): Secondary | ICD-10-CM | POA: Diagnosis not present

## 2024-01-10 DIAGNOSIS — I5032 Chronic diastolic (congestive) heart failure: Secondary | ICD-10-CM | POA: Diagnosis not present

## 2024-01-10 DIAGNOSIS — G4733 Obstructive sleep apnea (adult) (pediatric): Secondary | ICD-10-CM | POA: Diagnosis not present

## 2024-01-10 DIAGNOSIS — I130 Hypertensive heart and chronic kidney disease with heart failure and stage 1 through stage 4 chronic kidney disease, or unspecified chronic kidney disease: Secondary | ICD-10-CM | POA: Diagnosis not present

## 2024-01-11 ENCOUNTER — Telehealth (HOSPITAL_COMMUNITY): Payer: Self-pay | Admitting: Cardiology

## 2024-01-11 NOTE — Telephone Encounter (Signed)
 Patient called to report increase in HA and elevated B/P since decreasing b/p meds  Reports b/p went from 140 to 200's  Seen by PCP 4/2  Given RX hydrochlorothiazide 25 mg and methocarbamol prn HA -wanted to confirm if its ok to start hydrochlorothiazide with additional medications   Please advise

## 2024-01-12 NOTE — Telephone Encounter (Signed)
 Per DPR detailed message left on VM

## 2024-01-23 DIAGNOSIS — L821 Other seborrheic keratosis: Secondary | ICD-10-CM | POA: Diagnosis not present

## 2024-01-23 DIAGNOSIS — C8400 Mycosis fungoides, unspecified site: Secondary | ICD-10-CM | POA: Diagnosis not present

## 2024-01-23 DIAGNOSIS — D1801 Hemangioma of skin and subcutaneous tissue: Secondary | ICD-10-CM | POA: Diagnosis not present

## 2024-01-23 DIAGNOSIS — L819 Disorder of pigmentation, unspecified: Secondary | ICD-10-CM | POA: Diagnosis not present

## 2024-01-23 DIAGNOSIS — L858 Other specified epidermal thickening: Secondary | ICD-10-CM | POA: Diagnosis not present

## 2024-01-23 DIAGNOSIS — L738 Other specified follicular disorders: Secondary | ICD-10-CM | POA: Diagnosis not present

## 2024-01-23 DIAGNOSIS — L304 Erythema intertrigo: Secondary | ICD-10-CM | POA: Diagnosis not present

## 2024-03-11 DIAGNOSIS — G4733 Obstructive sleep apnea (adult) (pediatric): Secondary | ICD-10-CM | POA: Diagnosis not present

## 2024-03-21 DIAGNOSIS — I5032 Chronic diastolic (congestive) heart failure: Secondary | ICD-10-CM | POA: Diagnosis not present

## 2024-03-21 DIAGNOSIS — E785 Hyperlipidemia, unspecified: Secondary | ICD-10-CM | POA: Diagnosis not present

## 2024-03-21 DIAGNOSIS — Z713 Dietary counseling and surveillance: Secondary | ICD-10-CM | POA: Diagnosis not present

## 2024-03-21 DIAGNOSIS — Z6841 Body Mass Index (BMI) 40.0 and over, adult: Secondary | ICD-10-CM | POA: Diagnosis not present

## 2024-03-21 DIAGNOSIS — J449 Chronic obstructive pulmonary disease, unspecified: Secondary | ICD-10-CM | POA: Diagnosis not present

## 2024-03-21 DIAGNOSIS — N182 Chronic kidney disease, stage 2 (mild): Secondary | ICD-10-CM | POA: Diagnosis not present

## 2024-03-21 DIAGNOSIS — I272 Pulmonary hypertension, unspecified: Secondary | ICD-10-CM | POA: Diagnosis not present

## 2024-03-21 DIAGNOSIS — I130 Hypertensive heart and chronic kidney disease with heart failure and stage 1 through stage 4 chronic kidney disease, or unspecified chronic kidney disease: Secondary | ICD-10-CM | POA: Diagnosis not present

## 2024-06-12 DIAGNOSIS — G4733 Obstructive sleep apnea (adult) (pediatric): Secondary | ICD-10-CM | POA: Diagnosis not present

## 2024-07-04 ENCOUNTER — Encounter (HOSPITAL_BASED_OUTPATIENT_CLINIC_OR_DEPARTMENT_OTHER): Payer: Self-pay | Admitting: Pulmonary Disease

## 2024-07-04 ENCOUNTER — Ambulatory Visit (HOSPITAL_BASED_OUTPATIENT_CLINIC_OR_DEPARTMENT_OTHER): Admitting: Pulmonary Disease

## 2024-07-04 VITALS — BP 131/74 | HR 79 | Ht 61.0 in | Wt 234.9 lb

## 2024-07-04 DIAGNOSIS — I272 Pulmonary hypertension, unspecified: Secondary | ICD-10-CM

## 2024-07-04 DIAGNOSIS — G4733 Obstructive sleep apnea (adult) (pediatric): Secondary | ICD-10-CM

## 2024-07-04 DIAGNOSIS — Z23 Encounter for immunization: Secondary | ICD-10-CM | POA: Diagnosis not present

## 2024-07-04 NOTE — Patient Instructions (Signed)
  VISIT SUMMARY: Today, we discussed your obstructive sleep apnea, obstructive lung disease, and pulmonary hypertension. We reviewed your current treatments and made some adjustments to help manage your conditions more effectively.  YOUR PLAN: -OBSTRUCTIVE SLEEP APNEA: Obstructive sleep apnea is a condition where your breathing stops and starts during sleep. Your CPAP therapy is working well, and you should continue using it with the current settings. It has helped improve your daytime sleepiness and fatigue.  -OBSTRUCTIVE LUNG DISEASE: Obstructive lung disease makes it hard to breathe, especially during physical activities. You should continue using your Symbicort  inhaler every 12 hours. To help remember, try keeping your inhaler by your toothbrush.  -PULMONARY HYPERTENSION: Pulmonary hypertension is high blood pressure in the lungs, which can make it hard to walk long distances. We will order a repeat sonogram of your heart to check the pressure in your lungs.  INSTRUCTIONS: Please continue using your CPAP machine as directed and take your Symbicort  inhaler every 12 hours. We will schedule a sonogram for your heart to assess your pulmonary pressure. Follow up with us  after the sonogram to discuss the results.                      Contains text generated by [employer]bridge.

## 2024-07-04 NOTE — Progress Notes (Signed)
   Subjective:    Patient ID: [name], female    DOB: 1941/05/17, 83 y.o.   MRN: [id number]   83 yo ex smoker for follow-up of OSA and circadian rhythm disorder, freerunning type &  pulmonary hypertension. She smoked 30 pys quit 1987 - on symbicort  due to + BD response on PFTs, breo was expensive  Discussed the use of AI scribe software for clinical note transcription with the patient, who gave verbal consent to proceed.  History of Present Illness [name] is an 83 year old female with obstructive sleep apnea and pulmonary hypertension who presents for follow-up.  She experiences difficulty maintaining consistent CPAP usage due to falling asleep before using the machine and waking up at 3 AM. This results in variable usage times of four to seven hours per night.  She has stable breathing at rest but experiences dyspnea with exertion, such as walking up a hill. This limits her ability to engage in social activities.  She struggles with medication adherence due to her sleep schedule, often missing doses of her twice-daily regimen. A once-daily medication was more effective but is not covered by insurance.  She is a former smoker, which is relevant to her current pulmonary condition.     Significant tests/ events reviewed  PFTs 07/2023 ratio 91, FEV1 64% improved to 76% post BD, FVC 63%, TLC 94, DLCO 79%     CTA neg Echo 04/17/2023 with EF of 60 to 65%, grade 1 DD with mild MVR and moderate TVR with mild to moderate AVR , RVSP 48   04/2020 HST showed severe OSA >> AHI 32/h  Review of Systems  neg for any significant sore throat, dysphagia, itching, sneezing, nasal congestion or excess/ purulent secretions, fever, chills, sweats, unintended wt loss, pleuritic or exertional cp, hempoptysis, orthopnea pnd or change in chronic leg swelling. Also denies presyncope, palpitations, heartburn, abdominal pain, nausea, vomiting, diarrhea or change in bowel or urinary habits,  dysuria,hematuria, rash, arthralgias, visual complaints, headache, numbness weakness or ataxia.      Objective:   Physical Exam  Gen. Pleasant, obese, in no distress ENT - no lesions, no post nasal drip Neck: No JVD, no thyromegaly, no carotid bruits Lungs: no use of accessory muscles, no dullness to percussion, decreased without rales or rhonchi  Cardiovascular: Rhythm regular, heart sounds  normal, no murmurs or gallops, no peripheral edema Musculoskeletal: No deformities, no cyanosis or clubbing , no tremors       Assessment & Plan:   Assessment and Plan Assessment & Plan Obstructive sleep apnea Obstructive sleep apnea is well-managed with CPAP therapy. CPAP download shows auto settings of 5 to 15 cm H2O, with an average pressure of 14 cm H2O. Compliance averages 5 hours per night, with occasional missed nights. CPAP has improved daytime somnolence and fatigue. - Continue CPAP therapy with current settings.  Obstructive lung disease Obstructive lung disease is being treated with inhaled therapy. She reports difficulty with medication adherence due to sleep patterns, missing doses of her twice-daily medication. Symbicort  provides some benefit. - Continue Symbicort  inhaler, advise taking every 12 hours. - Educate on medication adherence strategies, such as keeping inhaler by toothbrush.  Pulmonary hypertension, WHO Group 2 or 3 Pulmonary hypertension, likely WHO Group 2 or 3, is stable. Symptoms include difficulty walking long distances. Previous echocardiogram showed high pressure. - Order repeat echo for the heart to assess pulmonary pressure.

## 2024-07-22 ENCOUNTER — Telehealth (HOSPITAL_COMMUNITY): Payer: Self-pay | Admitting: Cardiology

## 2024-07-28 ENCOUNTER — Other Ambulatory Visit (HOSPITAL_COMMUNITY): Payer: Self-pay | Admitting: Cardiology

## 2024-07-31 ENCOUNTER — Ambulatory Visit (HOSPITAL_COMMUNITY)
Admission: RE | Admit: 2024-07-31 | Discharge: 2024-07-31 | Disposition: A | Discharge status: Home / Self Care | Source: Ambulatory Visit | Attending: Cardiology | Admitting: Cardiology

## 2024-07-31 VITALS — BP 144/60 | HR 80 | Wt 233.2 lb

## 2024-07-31 DIAGNOSIS — I10 Essential (primary) hypertension: Secondary | ICD-10-CM | POA: Diagnosis not present

## 2024-07-31 DIAGNOSIS — I110 Hypertensive heart disease with heart failure: Secondary | ICD-10-CM | POA: Insufficient documentation

## 2024-07-31 DIAGNOSIS — G4733 Obstructive sleep apnea (adult) (pediatric): Secondary | ICD-10-CM | POA: Diagnosis not present

## 2024-07-31 DIAGNOSIS — I272 Pulmonary hypertension, unspecified: Secondary | ICD-10-CM | POA: Insufficient documentation

## 2024-07-31 DIAGNOSIS — I5030 Unspecified diastolic (congestive) heart failure: Secondary | ICD-10-CM | POA: Diagnosis not present

## 2024-07-31 DIAGNOSIS — Z79899 Other long term (current) drug therapy: Secondary | ICD-10-CM | POA: Diagnosis not present

## 2024-07-31 DIAGNOSIS — J449 Chronic obstructive pulmonary disease, unspecified: Secondary | ICD-10-CM | POA: Diagnosis not present

## 2024-07-31 NOTE — Patient Instructions (Signed)
 There has been no changes to your medications.  Your physician recommends that you schedule a follow-up appointment in: 1 year ( October 2026) ** PLEASE CALL THE OFFICE IN AUGUST 2026 TO ARRANGE YOUR FOLLOW UP APPOINTMENT.**  If you have any questions or concerns before your next appointment please send us  a message through [facility] or call our office at [phone].    TO LEAVE A MESSAGE FOR THE NURSE SELECT OPTION 2, PLEASE LEAVE A MESSAGE INCLUDING: YOUR NAME DATE OF BIRTH CALL BACK NUMBER REASON FOR CALL**this is important as we prioritize the call backs  YOU WILL RECEIVE A CALL BACK THE SAME DAY AS LONG AS YOU CALL BEFORE 4:00 PM  At the [facility], you and your health needs are our priority. As part of our continuing mission to provide you with exceptional heart care, we have created designated Provider Care Teams. These Care Teams include your primary Cardiologist (physician) and Advanced Practice Providers (APPs- Physician Assistants and Nurse Practitioners) who all work together to provide you with the care you need, when you need it.   You may see any of the following providers on your designated Care Team at your next follow up: Dr Toribio Fuel Dr Ezra Shuck Dr. Ria Commander Dr. [name] Amy Lenetta, NP Caffie Shed, [place] [facility], [place] Beckey Coe, NP [place] Lee, NP Ellouise Class, NP Tinnie Redman, PharmD Jaun Bash, PharmD   Please be sure to bring in all your medications bottles to every appointment.    Thank you for choosing  [facility]

## 2024-07-31 NOTE — Progress Notes (Signed)
   ADVANCED HEART FAILURE FOLLOW UP CLINIC NOTE  Referring Physician: Stephane Leita DEL, MD  Primary Care: Stephane Leita DEL, MD Primary Cardiologist:  HPI: [name] is a 83 y.o. female who presents for follow up of pulmonary hypertension.      05/2021 patient presented with complaint of shortness of breath and Dr. Claudene completed a coronary CTA that showed aortic atherosclerosis with no evidence of CAD and dilated pulmonary arteries suggestive of pulmonary HTN.  She continued to have complaints of shortness of breath and was seen by Dr. Delford on 09/14/2021.  2D echo completed 03/03/2020 showed no significant valve disease and EF of 60 to 65%.  She was started on Lasix  20 mg daily but was not taking daily.  Patient was advised to follow-up with pulmonology and blood pressures were noted to be well-controlled. 2D echo completed 04/17/2023 with EF of 60 to 65% and grade 1 DD with mild MVR and moderate TVR with mild to moderate AVR and thickening of the leaflets and no evidence of aortic stenosis.       SUBJECTIVE:  Reports that since her last visit she has noticed some dependent edema when on her feet or sitting for prolonged periods, improves overnight. She also notes some dyspnea on exertion, but not significantly changed since her last visit. She initially went down on her verapamil , but it was increased by her PCP later for mild hypertension. Otherwise, symptoms relatively unchanged.   PMH, current medications, allergies, social history, and family history reviewed in epic.  PHYSICAL EXAM: Vitals:   07/31/24 1519  Pulse: 80  SpO2: 98%   GENERAL: NAD, fair appearing PULM:  Normal work of breathing, CTAB CARDIAC:  JVP: flat         Normal rate with regular rhythm. No murmurs, rubs or gallops.  Trace edema. Warm and well perfused extremities. ABDOMEN: Soft, non-tender, non-distended. NEUROLOGIC: Patient is oriented x3 with no focal or lateralizing neurologic deficits.       ASSESSMENT & PLAN:  Pulmonary hypertension: Noted on previous echocardiogram, RVSP 48. Has known sleep apnea but treated, echo consistent with at least grade I DD. Not interested in RHC , suspect mixed Group II and III. No significant change in symptoms.   - Continue OSA therapy - Continue COPD therapy and blood pressure management.    HFpEF: BP mildly elevated, had recently gone back up on verapamil . Discussed potentially adding a third agent, will hold off at this time. She will keep a home BP log and discuss with PCP.  - Continue verapamil  360mg  daily - Continue valsartan 320mg  daily - No SGLT-2  Follow up in 1 year  [name], MD Advanced Heart Failure Mechanical Circulatory Support 07/31/24

## 2024-08-01 ENCOUNTER — Ambulatory Visit (HOSPITAL_BASED_OUTPATIENT_CLINIC_OR_DEPARTMENT_OTHER)

## 2024-08-01 DIAGNOSIS — I272 Pulmonary hypertension, unspecified: Secondary | ICD-10-CM

## 2024-08-01 LAB — ECHOCARDIOGRAM COMPLETE
AR max vel: 1.58 cm2
AV Area VTI: 1.67 cm2
AV Area mean vel: 1.54 cm2
AV Mean grad: 9 mmHg
AV Peak grad: 16.6 mmHg
AV Vena cont: 0.16 cm
Ao pk vel: 2.04 m/s
Area-P 1/2: 3.42 cm2
P 1/2 time: 550 ms
S' Lateral: 2.06 cm

## 2024-08-22 DIAGNOSIS — R350 Frequency of micturition: Secondary | ICD-10-CM | POA: Diagnosis not present

## 2024-08-22 DIAGNOSIS — N3281 Overactive bladder: Secondary | ICD-10-CM | POA: Diagnosis not present

## 2024-08-22 DIAGNOSIS — R399 Unspecified symptoms and signs involving the genitourinary system: Secondary | ICD-10-CM | POA: Diagnosis not present

## 2024-08-22 DIAGNOSIS — N390 Urinary tract infection, site not specified: Secondary | ICD-10-CM | POA: Diagnosis not present

## 2024-08-22 DIAGNOSIS — R351 Nocturia: Secondary | ICD-10-CM | POA: Diagnosis not present

## 2024-08-27 DIAGNOSIS — H40053 Ocular hypertension, bilateral: Secondary | ICD-10-CM | POA: Diagnosis not present

## 2024-08-28 DIAGNOSIS — Z1231 Encounter for screening mammogram for malignant neoplasm of breast: Secondary | ICD-10-CM | POA: Diagnosis not present

## 2024-12-23 ENCOUNTER — Ambulatory Visit (HOSPITAL_BASED_OUTPATIENT_CLINIC_OR_DEPARTMENT_OTHER)
